# Patient Record
Sex: Female | Born: 1950 | Race: White | Hispanic: No | Marital: Married | Smoking: Never smoker
Health system: Southern US, Community
[De-identification: ages and names within clinical notes are randomized; demographics above are authoritative.]

## PROBLEM LIST (undated history)

## (undated) DIAGNOSIS — M1990 Unspecified osteoarthritis, unspecified site: Secondary | ICD-10-CM

## (undated) DIAGNOSIS — Z973 Presence of spectacles and contact lenses: Secondary | ICD-10-CM

## (undated) DIAGNOSIS — E785 Hyperlipidemia, unspecified: Secondary | ICD-10-CM

## (undated) DIAGNOSIS — K219 Gastro-esophageal reflux disease without esophagitis: Secondary | ICD-10-CM

## (undated) HISTORY — PX: APPENDECTOMY: SHX54

## (undated) HISTORY — PX: COLONOSCOPY: SHX174

## (undated) HISTORY — PX: TONSILLECTOMY: SUR1361

---

## 1996-08-23 HISTORY — PX: CARPAL TUNNEL RELEASE: SHX101

## 2005-11-17 ENCOUNTER — Other Ambulatory Visit: Admission: RE | Admit: 2005-11-17 | Discharge: 2005-11-17 | Payer: Self-pay | Admitting: Family Medicine

## 2005-11-29 ENCOUNTER — Encounter: Admission: RE | Admit: 2005-11-29 | Discharge: 2005-11-29 | Payer: Self-pay | Admitting: Family Medicine

## 2007-02-22 ENCOUNTER — Other Ambulatory Visit: Admission: RE | Admit: 2007-02-22 | Discharge: 2007-02-22 | Payer: Self-pay | Admitting: Family Medicine

## 2007-03-14 ENCOUNTER — Encounter: Admission: RE | Admit: 2007-03-14 | Discharge: 2007-03-14 | Payer: Self-pay | Admitting: Family Medicine

## 2008-03-08 ENCOUNTER — Other Ambulatory Visit: Admission: RE | Admit: 2008-03-08 | Discharge: 2008-03-08 | Payer: Self-pay | Admitting: Family Medicine

## 2008-03-15 ENCOUNTER — Encounter: Admission: RE | Admit: 2008-03-15 | Discharge: 2008-03-15 | Payer: Self-pay | Admitting: Family Medicine

## 2009-03-11 ENCOUNTER — Other Ambulatory Visit: Admission: RE | Admit: 2009-03-11 | Discharge: 2009-03-11 | Payer: Self-pay | Admitting: Family Medicine

## 2010-03-18 ENCOUNTER — Other Ambulatory Visit: Admission: RE | Admit: 2010-03-18 | Discharge: 2010-03-18 | Payer: Self-pay | Admitting: Family Medicine

## 2010-03-20 ENCOUNTER — Encounter: Admission: RE | Admit: 2010-03-20 | Discharge: 2010-03-20 | Payer: Self-pay | Admitting: Family Medicine

## 2010-08-23 HISTORY — PX: EYE SURGERY: SHX253

## 2010-11-05 ENCOUNTER — Other Ambulatory Visit: Payer: Self-pay | Admitting: Family Medicine

## 2010-11-05 DIAGNOSIS — R609 Edema, unspecified: Secondary | ICD-10-CM

## 2010-11-05 DIAGNOSIS — R52 Pain, unspecified: Secondary | ICD-10-CM

## 2010-11-06 ENCOUNTER — Ambulatory Visit
Admission: RE | Admit: 2010-11-06 | Discharge: 2010-11-06 | Disposition: A | Discharge status: Home / Self Care | Payer: BC Managed Care – HMO | Source: Ambulatory Visit | Attending: Family Medicine | Admitting: Family Medicine

## 2010-11-06 DIAGNOSIS — R609 Edema, unspecified: Secondary | ICD-10-CM

## 2010-11-06 DIAGNOSIS — R52 Pain, unspecified: Secondary | ICD-10-CM

## 2010-12-09 ENCOUNTER — Ambulatory Visit (HOSPITAL_BASED_OUTPATIENT_CLINIC_OR_DEPARTMENT_OTHER)
Admission: RE | Admit: 2010-12-09 | Discharge: 2010-12-09 | Disposition: A | Discharge status: Home / Self Care | Payer: BC Managed Care – PPO | Source: Ambulatory Visit | Attending: Specialist | Admitting: Specialist

## 2010-12-09 DIAGNOSIS — H269 Unspecified cataract: Secondary | ICD-10-CM | POA: Insufficient documentation

## 2010-12-23 ENCOUNTER — Ambulatory Visit (HOSPITAL_BASED_OUTPATIENT_CLINIC_OR_DEPARTMENT_OTHER)
Admission: RE | Admit: 2010-12-23 | Discharge: 2010-12-23 | Disposition: A | Discharge status: Home / Self Care | Payer: BC Managed Care – PPO | Source: Ambulatory Visit | Attending: Specialist | Admitting: Specialist

## 2010-12-23 DIAGNOSIS — E7800 Pure hypercholesterolemia, unspecified: Secondary | ICD-10-CM | POA: Insufficient documentation

## 2010-12-23 DIAGNOSIS — H269 Unspecified cataract: Secondary | ICD-10-CM | POA: Insufficient documentation

## 2010-12-23 DIAGNOSIS — Z79899 Other long term (current) drug therapy: Secondary | ICD-10-CM | POA: Insufficient documentation

## 2011-03-19 NOTE — Op Note (Signed)
  [name], Isabel Nash             ACCOUNT NO.:  [id number]  MEDICAL RECORD NO.:  [id number]          PATIENT TYPE:  LOCATION:                                 FACILITY:  PHYSICIAN:  Chucky May, M.D.  DATE OF BIRTH:  [birth year]  DATE OF PROCEDURE: DATE OF DISCHARGE:                              OPERATIVE REPORT   PREOPERATIVE DIAGNOSIS:  Cataract, left eye.  POSTOPERATIVE DIAGNOSIS:  Cataract, left eye.  OPERATION PERFORMED:  Cataract extraction with intraocular lens implant, left eye.  INDICATIONS FOR SURGERY:  The patient is a 60 year old female with progressive decrease in visual acuity bothered by glare when driving especially at night.  On examination, she was found to have a posterior subcapsular cataract consistent with her complaints.  SURGEON:  Chucky May, MD  ANESTHESIA:  MAC.  DESCRIPTION OF PROCEDURE:  The patient was brought to the main operating room and placed in the supine position.  Anesthesia was obtained by means of topical 4% lidocaine drops of tetracaine.  She was then prepped and draped in the usual manner.  A lid speculum was inserted and the cornea was irrigated with 4% lidocaine drops.  The cornea was then entered temporally with a keratome with an additional port inferiorly. Viscoelastic was instilled into the anterior chamber and anterior capsulorrhexis was performed without difficulty.  The nucleus was mobilized by hydrodissection followed by phacoemulsification of the nucleus and removal of residual cortical material by irrigation and aspiration.  The posterior capsule was then polished.  Provisc was instilled into the capsular bag and a posterior chamber lens implant model MN60MA, power 4.0 diopters, was instilled into the capsular bag without difficulty.  Viscoelastic was removed by irrigation and aspiration and replaced with balanced salt solution.  The wounds were hydrated with balanced salt solution.  A single 10-0 nylon suture  was placed across the wound temporally.  The eye was then checked for fluid leaks and none were noted.  The eye was dressed with topical Vigamox, Pred Forte and a Fox shield, and the patient was taken to the recovery room in excellent condition where she received written and verbal instructions for her postoperative care and was scheduled for followup in 24 hours.          ______________________________ Chucky May, M.D.     DJD/MEDQ  D:  12/09/2010  T:  12/10/2010  Job:  [id number]  Electronically Signed by Nelson Chimes M.D. on 03/19/2011 09:25:18 AM

## 2011-03-19 NOTE — Op Note (Signed)
  Isabel Nash, Isabel Nash             ACCOUNT NO.:  [id number]  MEDICAL RECORD NO.:  [id number]          PATIENT TYPE:  LOCATION:                                 FACILITY:  PHYSICIAN:  Chucky May, M.D.  DATE OF BIRTH:  [birth year]  DATE OF PROCEDURE: DATE OF DISCHARGE:                              OPERATIVE REPORT   SURGEON:  Chucky May, M.D.  PREOPERATIVE DIAGNOSIS:  Cataract, right eye.  POSTOPERATIVE DIAGNOSIS:  Cataract, right eye.  OPERATION PERFORMED:  Cataract extraction with intraocular lens implant, right eye.  ANESTHESIA:  MAC.  The outpatient setting is the appropriate setting for this procedure.  INDICATIONS FOR SURGERY:  The patient is a 60 year old female with painless progressive decrease in vision so that she has difficulty seeing for activities of daily living.  Specifically she has difficulty with driving and reading.  PROCEDURE IN DETAIL:  The patient was brought to the main operating room and placed in the supine position.  Anesthesia was obtained by means of 4% lidocaine drops with tetracaine.  She was then prepped and draped in the usual manner.  The cornea was entered temporally with a 2.4 mm keratome with an additional port of 1 mm diameter superiorly. Viscoelastic was instilled into the anterior chamber and an anterior capsulorrhexis was performed without difficulty.  The nucleus was mobilized by hydrodissection followed by phacoemulsification.  When the lens had been phacoemulsified, residual cortical material was removed by irrigation and aspiration.  The posterior capsule was polished and a posterior chamber lens implant was placed in the bag without difficulty. The viscoelastic was removed by irrigation and aspiration and replaced with balanced salt solution.  The wound was then hydrated with balanced salt solution and checked for fluid leaks and none were noted.  The eye was dressed with topical Vigamox and Pred Forte as well as a Fox  shield and the patient was taken to the recovery room in excellent condition where she received written and verbal instructions for her postoperative care and was scheduled for followup in 24 hours.          ______________________________ Chucky May, M.D.     DJD/MEDQ  D:  12/24/2010  T:  12/24/2010  Job:  [id number]  Electronically Signed by Nelson Chimes M.D. on 03/19/2011 09:25:25 AM

## 2011-04-07 ENCOUNTER — Other Ambulatory Visit (HOSPITAL_COMMUNITY)
Admission: RE | Admit: 2011-04-07 | Discharge: 2011-04-07 | Disposition: A | Discharge status: Home / Self Care | Payer: BC Managed Care – PPO | Source: Ambulatory Visit | Attending: Family Medicine | Admitting: Family Medicine

## 2011-04-07 ENCOUNTER — Other Ambulatory Visit: Payer: Self-pay | Admitting: Family Medicine

## 2011-04-07 DIAGNOSIS — Z124 Encounter for screening for malignant neoplasm of cervix: Secondary | ICD-10-CM | POA: Insufficient documentation

## 2011-06-07 ENCOUNTER — Other Ambulatory Visit: Payer: Self-pay | Admitting: Family Medicine

## 2011-06-07 DIAGNOSIS — Z1231 Encounter for screening mammogram for malignant neoplasm of breast: Secondary | ICD-10-CM

## 2011-06-15 ENCOUNTER — Ambulatory Visit
Admission: RE | Admit: 2011-06-15 | Discharge: 2011-06-15 | Disposition: A | Discharge status: Home / Self Care | Payer: BC Managed Care – HMO | Source: Ambulatory Visit | Attending: Family Medicine | Admitting: Family Medicine

## 2011-06-15 DIAGNOSIS — Z1231 Encounter for screening mammogram for malignant neoplasm of breast: Secondary | ICD-10-CM

## 2012-09-15 ENCOUNTER — Other Ambulatory Visit: Payer: Self-pay | Admitting: Family Medicine

## 2012-09-15 DIAGNOSIS — Z1231 Encounter for screening mammogram for malignant neoplasm of breast: Secondary | ICD-10-CM

## 2012-09-19 ENCOUNTER — Ambulatory Visit
Admission: RE | Admit: 2012-09-19 | Discharge: 2012-09-19 | Disposition: A | Discharge status: Home / Self Care | Payer: BC Managed Care – PPO | Source: Ambulatory Visit | Attending: Family Medicine | Admitting: Family Medicine

## 2012-09-19 DIAGNOSIS — Z1231 Encounter for screening mammogram for malignant neoplasm of breast: Secondary | ICD-10-CM

## 2013-06-07 ENCOUNTER — Other Ambulatory Visit: Payer: Self-pay | Admitting: Family Medicine

## 2013-06-07 ENCOUNTER — Ambulatory Visit
Admission: RE | Admit: 2013-06-07 | Discharge: 2013-06-07 | Disposition: A | Discharge status: Home / Self Care | Payer: BC Managed Care – PPO | Source: Ambulatory Visit | Attending: Family Medicine | Admitting: Family Medicine

## 2013-06-07 DIAGNOSIS — Z23 Encounter for immunization: Secondary | ICD-10-CM

## 2013-08-08 ENCOUNTER — Other Ambulatory Visit: Payer: Self-pay | Admitting: Family Medicine

## 2013-08-08 DIAGNOSIS — Z780 Asymptomatic menopausal state: Secondary | ICD-10-CM

## 2013-08-09 ENCOUNTER — Other Ambulatory Visit: Payer: Self-pay | Admitting: Family Medicine

## 2013-08-09 DIAGNOSIS — Z1231 Encounter for screening mammogram for malignant neoplasm of breast: Secondary | ICD-10-CM

## 2013-09-24 ENCOUNTER — Ambulatory Visit
Admission: RE | Admit: 2013-09-24 | Discharge: 2013-09-24 | Disposition: A | Discharge status: Home / Self Care | Payer: Self-pay | Source: Ambulatory Visit | Attending: Family Medicine | Admitting: Family Medicine

## 2013-09-24 DIAGNOSIS — Z780 Asymptomatic menopausal state: Secondary | ICD-10-CM

## 2013-09-24 DIAGNOSIS — Z1231 Encounter for screening mammogram for malignant neoplasm of breast: Secondary | ICD-10-CM

## 2013-09-26 ENCOUNTER — Other Ambulatory Visit: Payer: Self-pay | Admitting: Family Medicine

## 2013-09-26 DIAGNOSIS — R928 Other abnormal and inconclusive findings on diagnostic imaging of breast: Secondary | ICD-10-CM

## 2013-10-05 ENCOUNTER — Ambulatory Visit
Admission: RE | Admit: 2013-10-05 | Discharge: 2013-10-05 | Disposition: A | Discharge status: Home / Self Care | Payer: BC Managed Care – PPO | Source: Ambulatory Visit | Attending: Family Medicine | Admitting: Family Medicine

## 2013-10-05 ENCOUNTER — Ambulatory Visit
Admission: RE | Admit: 2013-10-05 | Discharge: 2013-10-05 | Disposition: A | Discharge status: Home / Self Care | Payer: Self-pay | Source: Ambulatory Visit | Attending: Family Medicine | Admitting: Family Medicine

## 2013-10-05 DIAGNOSIS — R928 Other abnormal and inconclusive findings on diagnostic imaging of breast: Secondary | ICD-10-CM

## 2013-11-08 DIAGNOSIS — M21619 Bunion of unspecified foot: Secondary | ICD-10-CM | POA: Diagnosis not present

## 2013-11-08 DIAGNOSIS — M2020 Hallux rigidus, unspecified foot: Secondary | ICD-10-CM | POA: Diagnosis not present

## 2014-01-10 DIAGNOSIS — H31099 Other chorioretinal scars, unspecified eye: Secondary | ICD-10-CM | POA: Diagnosis not present

## 2014-01-10 DIAGNOSIS — H26499 Other secondary cataract, unspecified eye: Secondary | ICD-10-CM | POA: Diagnosis not present

## 2014-01-22 DIAGNOSIS — N649 Disorder of breast, unspecified: Secondary | ICD-10-CM | POA: Diagnosis not present

## 2014-04-08 DIAGNOSIS — R6889 Other general symptoms and signs: Secondary | ICD-10-CM | POA: Diagnosis not present

## 2014-04-08 DIAGNOSIS — J029 Acute pharyngitis, unspecified: Secondary | ICD-10-CM | POA: Diagnosis not present

## 2014-04-19 ENCOUNTER — Ambulatory Visit (INDEPENDENT_AMBULATORY_CARE_PROVIDER_SITE_OTHER): Payer: Self-pay

## 2014-04-19 ENCOUNTER — Ambulatory Visit (INDEPENDENT_AMBULATORY_CARE_PROVIDER_SITE_OTHER): Payer: Medicare Other | Admitting: Neurology

## 2014-04-19 DIAGNOSIS — G5602 Carpal tunnel syndrome, left upper limb: Secondary | ICD-10-CM

## 2014-04-19 DIAGNOSIS — G5600 Carpal tunnel syndrome, unspecified upper limb: Secondary | ICD-10-CM | POA: Diagnosis not present

## 2014-04-19 DIAGNOSIS — Z0289 Encounter for other administrative examinations: Secondary | ICD-10-CM

## 2014-04-19 NOTE — Progress Notes (Signed)
  [facility]    Provider:  Dr Jaynee Eagles Referring Provider: Jonathon Bellows, MD Primary Care Physician:  Jonathon Bellows, MD  CC:  Numbness left hand  HPI:  Isabel Nash is a 63 y.o. female here as a referral from Dr. Justin Mend for evaluation of left Carpal Tunnel Syndrome. Patient has had symptoms for years. Has had right Carpal Tunnel Release. More recently noticed that her left hand is getting numb especially when she rides her bike. Digits 1-4 involved and she gets shooting pains into her wrists. Sometimes she wakes with hand numbness. Denies weakness. Denies neck pain or radicular symptoms. Focused exam demonstrates intact strength including intrinsic hand muscles.   Summary:   Nerve Conductions were performed on the bilateral upper extremities.  Evaluation of the left median motor nerve showed prolonged distal onset latency (4.9 ms, N<4.).   The left Median 2nd Digit sensory nerve showed prolonged distal peak latency (4.39ms, N<3.9) The right Median motor and sensory conductions were within normal limits. Bilateral Ulnar sensory and motor conduction were within normal limits.  F Wave studies indicate that the right and left median F waves were within normal limits.  EMG needle study of the right Deltoid, Triceps, Pronator Teres,  First Dorsal Interosseus and Opponens Pollicis muscles were normal.   Conclusion:  There is electrophysiologic evidence for moderately-severe left Carpal Tunnel Syndrome. No indication of peripheral polyneuropathy.   Sarina Ill, MD  [facility]  [address] [place]  [place], [place] [address]  Phone [phone] Fax [phone]

## 2014-05-20 DIAGNOSIS — Z23 Encounter for immunization: Secondary | ICD-10-CM | POA: Diagnosis not present

## 2014-07-03 DIAGNOSIS — E780 Pure hypercholesterolemia: Secondary | ICD-10-CM | POA: Diagnosis not present

## 2014-07-03 DIAGNOSIS — G5602 Carpal tunnel syndrome, left upper limb: Secondary | ICD-10-CM | POA: Diagnosis not present

## 2014-07-04 ENCOUNTER — Other Ambulatory Visit: Payer: Self-pay | Admitting: Orthopedic Surgery

## 2014-07-08 DIAGNOSIS — L720 Epidermal cyst: Secondary | ICD-10-CM | POA: Diagnosis not present

## 2014-07-08 DIAGNOSIS — L821 Other seborrheic keratosis: Secondary | ICD-10-CM | POA: Diagnosis not present

## 2014-07-08 DIAGNOSIS — Z85828 Personal history of other malignant neoplasm of skin: Secondary | ICD-10-CM | POA: Diagnosis not present

## 2014-07-08 DIAGNOSIS — Z808 Family history of malignant neoplasm of other organs or systems: Secondary | ICD-10-CM | POA: Diagnosis not present

## 2014-07-08 DIAGNOSIS — D229 Melanocytic nevi, unspecified: Secondary | ICD-10-CM | POA: Diagnosis not present

## 2014-07-29 ENCOUNTER — Encounter (HOSPITAL_BASED_OUTPATIENT_CLINIC_OR_DEPARTMENT_OTHER): Payer: Self-pay | Admitting: *Deleted

## 2014-07-29 NOTE — Progress Notes (Signed)
No labs needed

## 2014-08-01 ENCOUNTER — Encounter (HOSPITAL_BASED_OUTPATIENT_CLINIC_OR_DEPARTMENT_OTHER): Payer: Self-pay | Admitting: Certified Registered"

## 2014-08-01 ENCOUNTER — Ambulatory Visit (HOSPITAL_BASED_OUTPATIENT_CLINIC_OR_DEPARTMENT_OTHER): Payer: Medicare Other | Admitting: Certified Registered"

## 2014-08-01 ENCOUNTER — Ambulatory Visit (HOSPITAL_BASED_OUTPATIENT_CLINIC_OR_DEPARTMENT_OTHER)
Admission: RE | Admit: 2014-08-01 | Discharge: 2014-08-01 | Disposition: A | Discharge status: Home / Self Care | Payer: Medicare Other | Source: Ambulatory Visit | Attending: Orthopedic Surgery | Admitting: Orthopedic Surgery

## 2014-08-01 ENCOUNTER — Encounter (HOSPITAL_BASED_OUTPATIENT_CLINIC_OR_DEPARTMENT_OTHER)
Admission: RE | Disposition: A | Discharge status: Home / Self Care | Payer: Self-pay | Source: Ambulatory Visit | Attending: Orthopedic Surgery

## 2014-08-01 DIAGNOSIS — K219 Gastro-esophageal reflux disease without esophagitis: Secondary | ICD-10-CM | POA: Diagnosis not present

## 2014-08-01 DIAGNOSIS — G5602 Carpal tunnel syndrome, left upper limb: Secondary | ICD-10-CM | POA: Insufficient documentation

## 2014-08-01 DIAGNOSIS — M1990 Unspecified osteoarthritis, unspecified site: Secondary | ICD-10-CM | POA: Insufficient documentation

## 2014-08-01 DIAGNOSIS — E785 Hyperlipidemia, unspecified: Secondary | ICD-10-CM | POA: Insufficient documentation

## 2014-08-01 HISTORY — PX: CARPAL TUNNEL RELEASE: SHX101

## 2014-08-01 HISTORY — DX: Presence of spectacles and contact lenses: Z97.3

## 2014-08-01 HISTORY — DX: Hyperlipidemia, unspecified: E78.5

## 2014-08-01 HISTORY — DX: Unspecified osteoarthritis, unspecified site: M19.90

## 2014-08-01 HISTORY — DX: Gastro-esophageal reflux disease without esophagitis: K21.9

## 2014-08-01 LAB — POCT HEMOGLOBIN-HEMACUE: HEMOGLOBIN: 16 g/dL — AB (ref 12.0–15.0)

## 2014-08-01 SURGERY — CARPAL TUNNEL RELEASE
Anesthesia: Monitor Anesthesia Care | Site: Wrist | Laterality: Left

## 2014-08-01 MED ORDER — CEFAZOLIN SODIUM-DEXTROSE 2-3 GM-% IV SOLR: 2.0000 g | INTRAVENOUS | Status: DC

## 2014-08-01 MED ORDER — MIDAZOLAM HCL 5 MG/5ML IJ SOLN
INTRAMUSCULAR | Status: DC | PRN
  Administered 2014-08-01: 1 mg via INTRAVENOUS

## 2014-08-01 MED ORDER — HYDROCODONE-ACETAMINOPHEN 5-325 MG PO TABS: ORAL_TABLET | ORAL | Status: AC

## 2014-08-01 MED ORDER — VANCOMYCIN HCL 1000 MG IV SOLR
1000.0000 mg | INTRAVENOUS | Status: DC | PRN
  Administered 2014-08-01: 1000 mg via INTRAVENOUS

## 2014-08-01 MED ORDER — FENTANYL CITRATE 0.05 MG/ML IJ SOLN: 50.0000 ug | INTRAMUSCULAR | Status: DC | PRN

## 2014-08-01 MED ORDER — VANCOMYCIN HCL IN DEXTROSE 1-5 GM/200ML-% IV SOLN
INTRAVENOUS | Status: AC
  Filled 2014-08-01: qty 200

## 2014-08-01 MED ORDER — ONDANSETRON HCL 4 MG/2ML IJ SOLN
INTRAMUSCULAR | Status: DC | PRN
  Administered 2014-08-01: 4 mg via INTRAVENOUS

## 2014-08-01 MED ORDER — LACTATED RINGERS IV SOLN
INTRAVENOUS | Status: DC
  Administered 2014-08-01: 08:00:00 via INTRAVENOUS

## 2014-08-01 MED ORDER — FENTANYL CITRATE 0.05 MG/ML IJ SOLN: 25.0000 ug | INTRAMUSCULAR | Status: DC | PRN

## 2014-08-01 MED ORDER — FENTANYL CITRATE 0.05 MG/ML IJ SOLN
INTRAMUSCULAR | Status: AC
  Filled 2014-08-01: qty 4

## 2014-08-01 MED ORDER — MIDAZOLAM HCL 2 MG/2ML IJ SOLN: 1.0000 mg | INTRAMUSCULAR | Status: DC | PRN

## 2014-08-01 MED ORDER — PROPOFOL 10 MG/ML IV BOLUS
INTRAVENOUS | Status: DC | PRN
  Administered 2014-08-01: 10 mg via INTRAVENOUS
  Administered 2014-08-01: 20 mg via INTRAVENOUS

## 2014-08-01 MED ORDER — LIDOCAINE HCL (PF) 0.5 % IJ SOLN
INTRAMUSCULAR | Status: DC | PRN
  Administered 2014-08-01: 25 mL via INTRAVENOUS

## 2014-08-01 MED ORDER — BUPIVACAINE HCL (PF) 0.25 % IJ SOLN
INTRAMUSCULAR | Status: DC | PRN
  Administered 2014-08-01: 10 mL

## 2014-08-01 MED ORDER — CEFAZOLIN SODIUM-DEXTROSE 2-3 GM-% IV SOLR
INTRAVENOUS | Status: AC
  Filled 2014-08-01: qty 50

## 2014-08-01 MED ORDER — FENTANYL CITRATE 0.05 MG/ML IJ SOLN
INTRAMUSCULAR | Status: DC | PRN
  Administered 2014-08-01: 50 ug via INTRAVENOUS

## 2014-08-01 MED ORDER — MIDAZOLAM HCL 2 MG/2ML IJ SOLN
INTRAMUSCULAR | Status: AC
  Filled 2014-08-01: qty 2

## 2014-08-01 MED ORDER — BUPIVACAINE HCL (PF) 0.25 % IJ SOLN
INTRAMUSCULAR | Status: AC
  Filled 2014-08-01: qty 30

## 2014-08-01 MED ORDER — CHLORHEXIDINE GLUCONATE 4 % EX LIQD: 60.0000 mL | Freq: Once | CUTANEOUS | Status: DC

## 2014-08-01 MED ORDER — PROPOFOL 10 MG/ML IV BOLUS
INTRAVENOUS | Status: AC
  Filled 2014-08-01: qty 20

## 2014-08-01 MED ORDER — ONDANSETRON HCL 4 MG/2ML IJ SOLN: 4.0000 mg | Freq: Once | INTRAMUSCULAR | Status: DC | PRN

## 2014-08-01 SURGICAL SUPPLY — 35 items
BANDAGE ELASTIC 3 VELCRO ST LF (GAUZE/BANDAGES/DRESSINGS) ×2 IMPLANT
BLADE MINI RND TIP GREEN BEAV (BLADE) IMPLANT
BLADE SURG 15 STRL LF DISP TIS (BLADE) ×2 IMPLANT
BLADE SURG 15 STRL SS (BLADE) ×4
BNDG CMPR 9X4 STRL LF SNTH (GAUZE/BANDAGES/DRESSINGS)
BNDG ESMARK 4X9 LF (GAUZE/BANDAGES/DRESSINGS) IMPLANT
BNDG GAUZE ELAST 4 BULKY (GAUZE/BANDAGES/DRESSINGS) ×2 IMPLANT
CHLORAPREP W/TINT 26ML (MISCELLANEOUS) ×2 IMPLANT
CORDS BIPOLAR (ELECTRODE) ×2 IMPLANT
COVER BACK TABLE 60X90IN (DRAPES) ×2 IMPLANT
COVER MAYO STAND STRL (DRAPES) ×2 IMPLANT
CUFF TOURNIQUET SINGLE 18IN (TOURNIQUET CUFF) ×2 IMPLANT
DRAPE EXTREMITY T 121X128X90 (DRAPE) ×2 IMPLANT
DRAPE SURG 17X23 STRL (DRAPES) ×2 IMPLANT
DRSG PAD ABDOMINAL 8X10 ST (GAUZE/BANDAGES/DRESSINGS) ×2 IMPLANT
GAUZE SPONGE 4X4 12PLY STRL (GAUZE/BANDAGES/DRESSINGS) ×2 IMPLANT
GAUZE XEROFORM 1X8 LF (GAUZE/BANDAGES/DRESSINGS) ×2 IMPLANT
GLOVE BIO SURGEON STRL SZ7.5 (GLOVE) ×2 IMPLANT
GLOVE BIOGEL PI IND STRL 8 (GLOVE) ×1 IMPLANT
GLOVE BIOGEL PI INDICATOR 8 (GLOVE) ×1
GOWN STRL REUS W/ TWL LRG LVL3 (GOWN DISPOSABLE) ×1 IMPLANT
GOWN STRL REUS W/TWL LRG LVL3 (GOWN DISPOSABLE) ×2
GOWN STRL REUS W/TWL XL LVL3 (GOWN DISPOSABLE) ×2 IMPLANT
NDL HYPO 25X1 1.5 SAFETY (NEEDLE) IMPLANT
NEEDLE HYPO 25X1 1.5 SAFETY (NEEDLE) IMPLANT
NS IRRIG 1000ML POUR BTL (IV SOLUTION) ×2 IMPLANT
PACK BASIN DAY SURGERY FS (CUSTOM PROCEDURE TRAY) ×2 IMPLANT
PADDING CAST ABS 4INX4YD NS (CAST SUPPLIES) ×1
PADDING CAST ABS COTTON 4X4 ST (CAST SUPPLIES) ×1 IMPLANT
STOCKINETTE 4X48 STRL (DRAPES) ×2 IMPLANT
SUT ETHILON 4 0 PS 2 18 (SUTURE) ×2 IMPLANT
SYR BULB 3OZ (MISCELLANEOUS) ×2 IMPLANT
SYR CONTROL 10ML LL (SYRINGE) IMPLANT
TOWEL OR 17X24 6PK STRL BLUE (TOWEL DISPOSABLE) ×4 IMPLANT
UNDERPAD 30X30 INCONTINENT (UNDERPADS AND DIAPERS) ×2 IMPLANT

## 2014-08-01 NOTE — Op Note (Signed)
[name], [name]:  [id number]  MEDICAL RECORD NO.:  [id number]  LOCATION:                                 FACILITY:  PHYSICIAN:  Leanora Cover, MD             DATE OF BIRTH:  DATE OF PROCEDURE:  08/01/2014 DATE OF DISCHARGE:                              OPERATIVE REPORT   PREOPERATIVE DIAGNOSIS:  Left carpal tunnel syndrome.  POSTOPERATIVE DIAGNOSIS:  Left carpal tunnel syndrome.  PROCEDURE:  Left carpal tunnel release.  SURGEON:  Leanora Cover, MD  ASSISTANT:  None.  ANESTHESIA:  Bier block.  IV FLUIDS:  Per anesthesia flow sheet.  ESTIMATED BLOOD LOSS:  Minimal.  COMPLICATIONS:  None.  SPECIMENS:  None.  TOURNIQUET TIME:  22 minutes.  DISPOSITION:  Stable to PACU.  INDICATIONS:  [name] is a 63 year old female, who has had pins and needle sensation in the left hand for approximately 20 years.  She has had a right carpal tunnel release in the past with good results.  She does have a left carpal release.  Risks, benefits, and alternatives of surgery were discussed including the risk of blood loss, infection, damage to nerves, vessels, tendons, ligaments, bone; failure of surgery; need for additional surgery, complications with wound healing, continued pain, recurrence of carpal tunnel syndrome and damage to motor branch. She voiced understanding of these risks, and elected to proceed..  OPERATIVE COURSE:  After being identified preoperatively by myself, the patient and I agreed upon the procedure and site of procedure.  Surgical site was marked.  The risks, benefits, and alternatives of surgery were reviewed and she wished to proceed.  Surgical consent had been signed. She was given IV vancomycin as preoperative antibiotic prophylaxis for Ceclor allergy.  She was transported to the operating room and placed on the operating room table in supine position with left upper extremity on armboard.  Bier block anesthesia was induced by  Anesthesiologist.  Left upper extremity was prepped and draped in normal sterile orthopedic fashion.  A surgical pause was performed between surgeons, anesthesia and operating room staff, and all were in agreement as to the patient, procedure, and site of procedure.  Tourniquet at the proximal aspect of the forearm had been inflated for the Bier block.  Incision was made over the transverse carpal ligament.  This was carried into subcutaneous tissues by spreading technique.  Bipolar electrocautery was used to obtain hemostasis.  The palmar fascia was sharply incised.  The ligament was identified and incised sharply.  It was incised distally first. Care was taken to ensure complete decompression distally and proximally. Scissors were used to split the distal aspect of the volar antebrachial fascia.  Finger was placed into the wound to ensure complete decompression which was the case.  The nerve was inspected.  It was adherent to the radial leaflet.  The motor branch was identified and was intact.  The wound was copiously irrigated with sterile saline.  It was closed with 4-0 nylon in a horizontal mattress fashion.  I injected with 10 mL of 0.25% plain Marcaine to aid in postoperative analgesia.  It was then dressed with sterile Xeroform, 4x4s,  and ABD and wrapped with Kerlix and Ace bandage.  Tourniquet was deflated at 22 minutes.  The fingertips were pink with brisk capillary refill after deflation of the tourniquet.  Preoperative drapes were broken down.  The patient was awoken from anesthesia safely.  She was transferred back to stretcher and taken to PACU in stable condition.  I will see her back in the office in 1 week for postoperative followup.  I will give her Norco 5/325, 1-2 p.o. q.6 hours p.r.n. pain, dispensed #40.     Leanora Cover, MD     KK/MEDQ  D:  08/01/2014  T:  08/01/2014  Job:  [id number]

## 2014-08-01 NOTE — H&P (Signed)
  Isabel Nash is an 63 y.o. female.   Chief Complaint: carpal tunnel syndrome HPI: 63 yo rhd female with 20+ years pins and needles sensation in left hand.  It is bothersome to her.  She notes it mostly with sewing.  Has had a previous right carpal tunnel release.  Splinting and antiinflammatories without relief.  She wishes to have a left carpal tunnel release.  Past Medical History  Diagnosis Date  . GERD (gastroesophageal reflux disease)   . Arthritis   . Hyperlipemia   . Wears glasses     Past Surgical History  Procedure Laterality Date  . Tonsillectomy    . Appendectomy    . Carpal tunnel release  1998    right  . Eye surgery  2012    both cataracts  . Colonoscopy      History reviewed. No pertinent family history. Social History:  reports that she has never smoked. She does not have any smokeless tobacco history on file. She reports that she drinks alcohol. She reports that she does not use illicit drugs.  Allergies:  Allergies  Allergen Reactions  . Ceclor [Cefaclor] Hives    Face swells  . Keflex [Cephalexin] Hives    Medications Prior to Admission  Medication Sig Dispense Refill  . Calcium Carb-Cholecalciferol (CALCIUM 1000 + D) 1000-800 MG-UNIT TABS Take by mouth.    . naproxen sodium (ANAPROX) 220 MG tablet Take 220 mg by mouth 2 (two) times daily with a meal.    . ranitidine (ZANTAC) 150 MG tablet Take 150 mg by mouth 2 (two) times daily.    . simvastatin (ZOCOR) 20 MG tablet Take 20 mg by mouth daily at 6 PM.      Results for orders placed or performed during the hospital encounter of 08/01/14 (from the past 48 hour(s))  Hemoglobin-hemacue, POC     Status: Abnormal   Collection Time: 08/01/14  7:44 AM  Result Value Ref Range   Hemoglobin 16.0 (H) 12.0 - 15.0 g/dL    No results found.   A comprehensive review of systems was negative except for: Eyes: positive for cataracts and contacts/glasses Musculoskeletal: positive for arthralgias  Blood  pressure 104/72, pulse 91, temperature 98 F (36.7 C), temperature source Oral, resp. rate 20, height 5' (1.524 m), weight 56.246 kg (124 lb), SpO2 100 %.  General appearance: alert, cooperative and appears stated age Head: Normocephalic, without obvious abnormality, atraumatic Neck: supple, symmetrical, trachea midline Resp: clear to auscultation bilaterally Cardio: regular rate and rhythm GI: non tender Extremities: intact sensation and capillary refill all digits.  +epl/fpl/io.  no wounds. Pulses: 2+ and symmetric Skin: Skin color, texture, turgor normal. No rashes or lesions Neurologic: Grossly normal Incision/Wound: None  Assessment/Plan Left carpal tunnel syndrome.  Non operative and operative treatment options were discussed with the patient and patient wishes to proceed with operative treatment. Risks, benefits, and alternatives of surgery were discussed and the patient agrees with the plan of care.   Avraham Benish R 08/01/2014, 8:28 AM

## 2014-08-01 NOTE — Anesthesia Postprocedure Evaluation (Signed)
  Anesthesia Post-op Note  Patient: Isabel Nash  Procedure(s) Performed: Procedure(s): LEFT CARPAL TUNNEL RELEASE (Left)  Patient Location: PACU  Anesthesia Type: MAC, Bier Block   Level of Consciousness: awake, alert  and oriented  Airway and Oxygen Therapy: Patient Spontanous Breathing  Post-op Pain: none  Post-op Assessment: Post-op Vital signs reviewed  Post-op Vital Signs: Reviewed  Last Vitals:  Filed Vitals:   08/01/14 1016  BP: 101/61  Pulse: 74  Temp: 36.4 C  Resp: 18    Complications: No apparent anesthesia complications

## 2014-08-01 NOTE — Brief Op Note (Signed)
08/01/2014  9:09 AM  PATIENT:  Isabel Nash  63 y.o. female  PRE-OPERATIVE DIAGNOSIS:  LEFT CARPAL TUNNEL SYNDROME  POST-OPERATIVE DIAGNOSIS:  left carpal tunnel release  PROCEDURE:  Procedure(s): LEFT CARPAL TUNNEL RELEASE (Left)  SURGEON:  Surgeon(s) and Role:    * Leanora Cover, MD - Primary  PHYSICIAN ASSISTANT:   ASSISTANTS: none   ANESTHESIA:   Bier block  EBL:  Total I/O In: 800 [I.V.:800] Out: -   BLOOD ADMINISTERED:none  DRAINS: none   LOCAL MEDICATIONS USED:  MARCAINE     SPECIMEN:  No Specimen  DISPOSITION OF SPECIMEN:  N/A  COUNTS:  YES  TOURNIQUET:   Total Tourniquet Time Documented: Forearm (Left) - 23 minutes Total: Forearm (Left) - 23 minutes   DICTATION: .Other Dictation: Dictation Number ?  PLAN OF CARE: Discharge to home after PACU  PATIENT DISPOSITION:  PACU - hemodynamically stable.

## 2014-08-01 NOTE — Discharge Instructions (Addendum)

## 2014-08-01 NOTE — Transfer of Care (Signed)
Immediate Anesthesia Transfer of Care Note  Patient: Isabel Nash  Procedure(s) Performed: Procedure(s): LEFT CARPAL TUNNEL RELEASE (Left)  Patient Location: PACU  Anesthesia Type:MAC and Bier block  Level of Consciousness: awake, alert  and oriented  Airway & Oxygen Therapy: Patient Spontanous Breathing and Patient connected to face mask oxygen  Post-op Assessment: Report given to PACU RN, Post -op Vital signs reviewed and stable and Patient moving all extremities  Post vital signs: Reviewed and stable  Complications: No apparent anesthesia complications

## 2014-08-01 NOTE — Anesthesia Procedure Notes (Signed)
Procedure Name: MAC Date/Time: 08/01/2014 8:50 AM Performed by: Baxter Flattery Pre-anesthesia Checklist: Patient identified, Emergency Drugs available, Suction available and Patient being monitored Patient Re-evaluated:Patient Re-evaluated prior to inductionOxygen Delivery Method: Simple face mask Preoxygenation: Pre-oxygenation with 100% oxygen Dental Injury: Teeth and Oropharynx as per pre-operative assessment

## 2014-08-01 NOTE — Anesthesia Preprocedure Evaluation (Signed)
Anesthesia Evaluation  Patient identified by MRN, date of birth, ID band Patient awake    Reviewed: Allergy & Precautions, H&P , NPO status , Patient's Chart, lab work & pertinent test results  Airway Mallampati: II  TM Distance: >3 FB Neck ROM: Full    Dental  (+) Teeth Intact, Dental Advisory Given   Pulmonary  breath sounds clear to auscultation        Cardiovascular Rhythm:Regular Rate:Normal     Neuro/Psych    GI/Hepatic GERD-  Medicated and Controlled,  Endo/Other    Renal/GU      Musculoskeletal   Abdominal   Peds  Hematology   Anesthesia Other Findings   Reproductive/Obstetrics                             Anesthesia Physical Anesthesia Plan  ASA: II  Anesthesia Plan: MAC and Bier Block   Post-op Pain Management:    Induction: Intravenous  Airway Management Planned: Simple Face Mask  Additional Equipment:   Intra-op Plan:   Post-operative Plan:   Informed Consent: I have reviewed the patients History and Physical, chart, labs and discussed the procedure including the risks, benefits and alternatives for the proposed anesthesia with the patient or authorized representative who has indicated his/her understanding and acceptance.   Dental advisory given  Plan Discussed with: CRNA, Anesthesiologist and Surgeon  Anesthesia Plan Comments:         Anesthesia Quick Evaluation

## 2014-08-02 ENCOUNTER — Encounter (HOSPITAL_BASED_OUTPATIENT_CLINIC_OR_DEPARTMENT_OTHER): Payer: Self-pay | Admitting: Orthopedic Surgery

## 2014-10-02 DIAGNOSIS — M79604 Pain in right leg: Secondary | ICD-10-CM | POA: Diagnosis not present

## 2014-10-07 DIAGNOSIS — M25551 Pain in right hip: Secondary | ICD-10-CM | POA: Diagnosis not present

## 2014-10-07 DIAGNOSIS — M25561 Pain in right knee: Secondary | ICD-10-CM | POA: Diagnosis not present

## 2014-11-01 ENCOUNTER — Other Ambulatory Visit (HOSPITAL_COMMUNITY)
Admission: RE | Admit: 2014-11-01 | Discharge: 2014-11-01 | Disposition: A | Discharge status: Home / Self Care | Payer: Medicare Other | Source: Ambulatory Visit | Attending: Family Medicine | Admitting: Family Medicine

## 2014-11-01 ENCOUNTER — Other Ambulatory Visit: Payer: Self-pay | Admitting: Family Medicine

## 2014-11-01 DIAGNOSIS — Z124 Encounter for screening for malignant neoplasm of cervix: Secondary | ICD-10-CM | POA: Insufficient documentation

## 2014-11-01 DIAGNOSIS — E780 Pure hypercholesterolemia: Secondary | ICD-10-CM | POA: Diagnosis not present

## 2014-11-01 DIAGNOSIS — Z0000 Encounter for general adult medical examination without abnormal findings: Secondary | ICD-10-CM | POA: Diagnosis not present

## 2014-11-01 DIAGNOSIS — Z23 Encounter for immunization: Secondary | ICD-10-CM | POA: Diagnosis not present

## 2014-11-01 DIAGNOSIS — Z1151 Encounter for screening for human papillomavirus (HPV): Secondary | ICD-10-CM | POA: Diagnosis present

## 2014-11-06 LAB — CYTOLOGY - PAP

## 2014-12-06 ENCOUNTER — Other Ambulatory Visit: Payer: Self-pay

## 2014-12-06 DIAGNOSIS — Z1231 Encounter for screening mammogram for malignant neoplasm of breast: Secondary | ICD-10-CM

## 2014-12-23 ENCOUNTER — Ambulatory Visit: Payer: BC Managed Care – HMO

## 2014-12-31 ENCOUNTER — Ambulatory Visit
Admission: RE | Admit: 2014-12-31 | Discharge: 2014-12-31 | Disposition: A | Discharge status: Home / Self Care | Payer: Medicare Other | Source: Ambulatory Visit

## 2014-12-31 DIAGNOSIS — Z1231 Encounter for screening mammogram for malignant neoplasm of breast: Secondary | ICD-10-CM | POA: Diagnosis not present

## 2015-01-27 DIAGNOSIS — H26493 Other secondary cataract, bilateral: Secondary | ICD-10-CM | POA: Diagnosis not present

## 2015-01-27 DIAGNOSIS — H04123 Dry eye syndrome of bilateral lacrimal glands: Secondary | ICD-10-CM | POA: Diagnosis not present

## 2015-02-07 DIAGNOSIS — E780 Pure hypercholesterolemia: Secondary | ICD-10-CM | POA: Diagnosis not present

## 2015-03-12 DIAGNOSIS — M65312 Trigger thumb, left thumb: Secondary | ICD-10-CM | POA: Diagnosis not present

## 2015-05-09 DIAGNOSIS — M65312 Trigger thumb, left thumb: Secondary | ICD-10-CM | POA: Diagnosis not present

## 2015-06-12 DIAGNOSIS — M25561 Pain in right knee: Secondary | ICD-10-CM | POA: Diagnosis not present

## 2015-06-19 DIAGNOSIS — M25561 Pain in right knee: Secondary | ICD-10-CM | POA: Diagnosis not present

## 2015-07-24 DIAGNOSIS — L821 Other seborrheic keratosis: Secondary | ICD-10-CM | POA: Diagnosis not present

## 2015-07-24 DIAGNOSIS — Z85828 Personal history of other malignant neoplasm of skin: Secondary | ICD-10-CM | POA: Diagnosis not present

## 2015-07-24 DIAGNOSIS — D235 Other benign neoplasm of skin of trunk: Secondary | ICD-10-CM | POA: Diagnosis not present

## 2015-07-28 DIAGNOSIS — Z23 Encounter for immunization: Secondary | ICD-10-CM | POA: Diagnosis not present

## 2015-08-01 DIAGNOSIS — M65312 Trigger thumb, left thumb: Secondary | ICD-10-CM | POA: Diagnosis not present

## 2015-08-11 DIAGNOSIS — M65312 Trigger thumb, left thumb: Secondary | ICD-10-CM | POA: Diagnosis not present

## 2015-11-12 DIAGNOSIS — Z23 Encounter for immunization: Secondary | ICD-10-CM | POA: Diagnosis not present

## 2015-11-12 DIAGNOSIS — K219 Gastro-esophageal reflux disease without esophagitis: Secondary | ICD-10-CM | POA: Diagnosis not present

## 2015-11-12 DIAGNOSIS — Z0000 Encounter for general adult medical examination without abnormal findings: Secondary | ICD-10-CM | POA: Diagnosis not present

## 2015-11-12 DIAGNOSIS — E782 Mixed hyperlipidemia: Secondary | ICD-10-CM | POA: Diagnosis not present

## 2015-11-13 ENCOUNTER — Other Ambulatory Visit: Payer: Self-pay | Admitting: Family Medicine

## 2015-11-13 DIAGNOSIS — E2839 Other primary ovarian failure: Secondary | ICD-10-CM

## 2015-11-13 DIAGNOSIS — Z1231 Encounter for screening mammogram for malignant neoplasm of breast: Secondary | ICD-10-CM

## 2015-12-16 DIAGNOSIS — H903 Sensorineural hearing loss, bilateral: Secondary | ICD-10-CM | POA: Diagnosis not present

## 2016-01-08 ENCOUNTER — Ambulatory Visit
Admission: RE | Admit: 2016-01-08 | Discharge: 2016-01-08 | Disposition: A | Discharge status: Home / Self Care | Payer: Medicare Other | Source: Ambulatory Visit | Attending: Family Medicine | Admitting: Family Medicine

## 2016-01-08 DIAGNOSIS — M8589 Other specified disorders of bone density and structure, multiple sites: Secondary | ICD-10-CM | POA: Diagnosis not present

## 2016-01-08 DIAGNOSIS — Z780 Asymptomatic menopausal state: Secondary | ICD-10-CM | POA: Diagnosis not present

## 2016-01-08 DIAGNOSIS — E2839 Other primary ovarian failure: Secondary | ICD-10-CM

## 2016-01-08 DIAGNOSIS — Z1231 Encounter for screening mammogram for malignant neoplasm of breast: Secondary | ICD-10-CM

## 2016-01-12 ENCOUNTER — Other Ambulatory Visit: Payer: Self-pay | Admitting: Family Medicine

## 2016-01-12 DIAGNOSIS — R928 Other abnormal and inconclusive findings on diagnostic imaging of breast: Secondary | ICD-10-CM

## 2016-01-20 ENCOUNTER — Ambulatory Visit
Admission: RE | Admit: 2016-01-20 | Discharge: 2016-01-20 | Disposition: A | Discharge status: Home / Self Care | Payer: Medicare Other | Source: Ambulatory Visit | Attending: Family Medicine | Admitting: Family Medicine

## 2016-01-20 DIAGNOSIS — R928 Other abnormal and inconclusive findings on diagnostic imaging of breast: Secondary | ICD-10-CM

## 2016-02-18 DIAGNOSIS — H524 Presbyopia: Secondary | ICD-10-CM | POA: Diagnosis not present

## 2016-02-18 DIAGNOSIS — H26493 Other secondary cataract, bilateral: Secondary | ICD-10-CM | POA: Diagnosis not present

## 2016-02-18 DIAGNOSIS — H353122 Nonexudative age-related macular degeneration, left eye, intermediate dry stage: Secondary | ICD-10-CM | POA: Diagnosis not present

## 2016-03-18 ENCOUNTER — Ambulatory Visit: Payer: Medicare Other | Attending: Otolaryngology | Admitting: Audiology

## 2016-03-18 DIAGNOSIS — H903 Sensorineural hearing loss, bilateral: Secondary | ICD-10-CM | POA: Diagnosis not present

## 2016-03-18 DIAGNOSIS — H93293 Other abnormal auditory perceptions, bilateral: Secondary | ICD-10-CM | POA: Diagnosis not present

## 2016-03-18 DIAGNOSIS — Z0110 Encounter for examination of ears and hearing without abnormal findings: Secondary | ICD-10-CM | POA: Diagnosis not present

## 2016-03-18 NOTE — Patient Instructions (Signed)
  Auditory processing self-help computer programs are now available for IPAD and computer download.  Benefit has been shown with intensive use for 10-15 minutes,  4-5 days per week. Research is suggesting that using the programs for a short amount of time each day is better for the auditory processing development than completing the program in a short amount of time by doing it several hours per day.  [employer]  IPAD or PC download (Start with Phonological Awareness for decoding issues-which is the largest, most intensive program in this set.  Once Phonological Awareness is completed continue auditory processing work with the other [employer] programs: Auditory memory, Following Directions and Sequencing using the same 10-15 minutes, 4-5 days per week)                [employer]  (teenage to adult)     PC download or cd                                                    www.neurotone.com          Other self-help measures include: 1) have conversation face to face  2) minimize background noise when having a conversation- turn off the TV, move to a quiet area of the area 3) be aware that auditory processing problems become worse with fatigue and stress  4) Avoid having important conversation with [employer] back to the speaker.

## 2016-03-18 NOTE — Procedures (Signed)
[facility] [place], [place]  [address] [phone]  AUDIOLOGICAL AND AUDITORY PROCESSING EVALUATION  NAME: Isabel Nash  STATUS: Outpatient DOB:   [birth year]   DIAGNOSIS: Evaluate for Central auditory                                                                                    processing disorder  MRN: [id number]                                                                                      DATE: 03/18/2016   REFERENT:  Dr. Vicie Mutters, ENT   HISTORY: Isabel Nash, who recently retired as a Education officer, museum, was seen for a central auditory processing evaluation. She reports difficulty hearing in background noise at home and in social situations which is beginning to affect her communication confidence, creating some stress.  She also reports having difficulty understanding children and her husband at times.  Isabel Nash states that she has "low blood pressure" and feels unsteady in the morning, but that she does not have dizziness, vertigo, ear pressure or pain.  EVALUATION: Pure tone air conduction testing showed hearing thresholds of 10-20 dBHl from 250Hz  - 1500Hz ; 2000Hz  has 15 dBHL: on the left and 30 dBHL on the right ; 25-30 dBHL at 4000Hz ; 45 dBHL at 6000hz  and 55-65 dBHL at 8000Hz . The hearing loss is sensorineural bilaterally.   Speech reception thresholds are 10 dBHL on the left and 15 dBHL on the right using recorded spondee word lists. Word recognition was 96% at 55 dBHL on the right and 50 dBHL on the left using recorded NU-6 word lists, in quiet.  Additional testing not repeated since it was completed in April 2017.  A summary of Isabel Nash's central auditory processing evaluation is as follows: Uncomfortable Loudness Testing was performed using monitored live voice.  Isabel Nash reported that noise levels of 75 dBHL were "uncomfortably loud" in each ear.  Volume started to "hurt" at 100dBHL on the right side and  95 dBHL on the left side.     Speech-in-Noise testing was performed to determine speech discrimination in the presence of background noise.  Isabel Nash scored 68% in the right ear and 56 in the left ear, when noise was presented 5 dB below speech. Isabel Nash is expected to have significant difficulty hearing and understanding in minimal background noise.       Competing Sentences (CS) involved a different sentences being presented to each ear at different volumes. The instructions are to repeat the softer volume sentences. Posterior temporal issues will show poorer performance in the ear contralateral to the lobe involved.  Isabel Nash scored 100% in the right ear and 100% in the left ear.  The test results are within normal limits bilaterally.  Dichotic Digits (DD) presents different two digits to each  ear. All four digits are to be repeated. Poor performance suggests that cerebellar and/or brainstem may be involved. Isabel Nash scored 100% in the right ear and 100% in the left ear. The test results are within normal limits bilaterally.  Musiek's Frequency (Pitch) Pattern Test requires identification of high and low pitch tones presented each ear individually. Poor performance may occur with organization, learning issues or dyslexia.  Isabel Nash scored 100% on the left and 96% on the right which is within normal on this auditory processing test.   Summary of Isabel Nash's areas of difficulty: Poor Word Recognition in Minimal Background Noise is the inability to hear in the presence of competing noise. This problem may be easily mistaken for inattention.  Hearing may be excellent in a quiet room but become very poor when a fan, air conditioner or heater come on, paper is rattled or music is turned on. The background noise does not have to "sound loud" to a normal listener in order for it to be a problem for someone with an auditory processing disorder.     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   Word recognition is excellent in quiet but drops to poor in minimal background noise in each ear. Significant difficulty in most social situations is expected.  Previous audiograms were not available for comparison, but Isabel Nash plans to follow-up with Dr. Thornell Mule, ENT.  Although by history Isabel Nash is suspect of poor auditory processing, the test battery administered today is negative for  Central Auditory Processing Disorder. That said, since Isabel Nash is being adversely affected by her poor word recognition in background noise proactive measures were discussed with her that may be helpful both now and in the future (detailed under recommendations).  Please also be aware that low level amplification has been used in children with normal hearing thresholds to help with auditory processing so that this may be an option for Isabel Nash.  In addition, closely monitor hearing because the perception of poorer hearing sometimes occurs before additional hearing loss is evident on an audiogram.   RECOMMENDATIONS: 1)  Consider a hearing aid evaluation  2)  Consider proactive measures for optimum auditory processing. Auditory processing self-help computer programs are now available for IPAD and computer download.  Benefit has been shown with intensive use for 10-15 minutes,  4-5 days per week. Research is suggesting that using the programs for a short amount of time each day is better for the auditory processing development than completing the program in a short amount of time by doing it several hours per day.  [email or web address]  IPAD or PC download (Start with Phonological Awareness for decoding issues-which is the largest, most intensive program in this set.  Once Phonological Awareness is completed continue auditory processing work with the other Isabel Nash programs: Auditory memory, Following  Directions and Sequencing using the same 10-15 minutes, 4-5 days per week)                [employer]  (teenage to adult)     PC download or cd                                                    www.neurotone.com          3. Other  self-help measures include: 1) have conversation face to face  2) minimize background noise when having a conversation- turn off the TV, move to a quiet area of the area 3) be aware that auditory processing problems become worse with fatigue and stress  4) Avoid having important conversation with Lauryn's back to the speaker.   4.  Please be aware that current research strongly indicates that learning to play a musical instrument results in improved neurological function related to auditory processing that benefits decoding and hearing in background noise. Consider learning to  play a musical instrument for 1-2 years. Please be aware that being able to play the instrument well does not seem to matter, the benefit comes with the learning. Please refer to the following website for further info: www.brainvolts at [facility], Annia Friendly, PhD.   5.  Monitor hearing at home and with Dr. Mayer Masker office.  Deborah L. Heide Spark, Au.D., CCC-A Doctor of Audiology 03/18/2016

## 2016-04-08 DIAGNOSIS — M545 Low back pain: Secondary | ICD-10-CM | POA: Diagnosis not present

## 2016-04-12 DIAGNOSIS — M5136 Other intervertebral disc degeneration, lumbar region: Secondary | ICD-10-CM | POA: Diagnosis not present

## 2016-04-12 DIAGNOSIS — M545 Low back pain: Secondary | ICD-10-CM | POA: Diagnosis not present

## 2016-05-19 DIAGNOSIS — Z23 Encounter for immunization: Secondary | ICD-10-CM | POA: Diagnosis not present

## 2016-06-18 DIAGNOSIS — R05 Cough: Secondary | ICD-10-CM | POA: Diagnosis not present

## 2016-09-15 DIAGNOSIS — L821 Other seborrheic keratosis: Secondary | ICD-10-CM | POA: Diagnosis not present

## 2016-09-15 DIAGNOSIS — Z85828 Personal history of other malignant neoplasm of skin: Secondary | ICD-10-CM | POA: Diagnosis not present

## 2016-09-15 DIAGNOSIS — Z808 Family history of malignant neoplasm of other organs or systems: Secondary | ICD-10-CM | POA: Diagnosis not present

## 2016-09-15 DIAGNOSIS — Z23 Encounter for immunization: Secondary | ICD-10-CM | POA: Diagnosis not present

## 2016-09-15 DIAGNOSIS — D2271 Melanocytic nevi of right lower limb, including hip: Secondary | ICD-10-CM | POA: Diagnosis not present

## 2016-09-15 DIAGNOSIS — D225 Melanocytic nevi of trunk: Secondary | ICD-10-CM | POA: Diagnosis not present

## 2016-09-27 DIAGNOSIS — Z111 Encounter for screening for respiratory tuberculosis: Secondary | ICD-10-CM | POA: Diagnosis not present

## 2016-11-04 DIAGNOSIS — H353122 Nonexudative age-related macular degeneration, left eye, intermediate dry stage: Secondary | ICD-10-CM | POA: Diagnosis not present

## 2016-11-04 DIAGNOSIS — H26491 Other secondary cataract, right eye: Secondary | ICD-10-CM | POA: Diagnosis not present

## 2016-11-04 DIAGNOSIS — Z961 Presence of intraocular lens: Secondary | ICD-10-CM | POA: Diagnosis not present

## 2016-11-04 DIAGNOSIS — H524 Presbyopia: Secondary | ICD-10-CM | POA: Diagnosis not present

## 2016-11-12 DIAGNOSIS — H43813 Vitreous degeneration, bilateral: Secondary | ICD-10-CM | POA: Diagnosis not present

## 2016-11-12 DIAGNOSIS — H43393 Other vitreous opacities, bilateral: Secondary | ICD-10-CM | POA: Diagnosis not present

## 2016-11-12 DIAGNOSIS — H35413 Lattice degeneration of retina, bilateral: Secondary | ICD-10-CM | POA: Diagnosis not present

## 2016-11-12 DIAGNOSIS — H15832 Staphyloma posticum, left eye: Secondary | ICD-10-CM | POA: Diagnosis not present

## 2016-12-01 DIAGNOSIS — Z5181 Encounter for therapeutic drug level monitoring: Secondary | ICD-10-CM | POA: Diagnosis not present

## 2016-12-01 DIAGNOSIS — E782 Mixed hyperlipidemia: Secondary | ICD-10-CM | POA: Diagnosis not present

## 2016-12-07 DIAGNOSIS — E782 Mixed hyperlipidemia: Secondary | ICD-10-CM | POA: Diagnosis not present

## 2016-12-07 DIAGNOSIS — Z23 Encounter for immunization: Secondary | ICD-10-CM | POA: Diagnosis not present

## 2016-12-07 DIAGNOSIS — F418 Other specified anxiety disorders: Secondary | ICD-10-CM | POA: Diagnosis not present

## 2016-12-07 DIAGNOSIS — Z0000 Encounter for general adult medical examination without abnormal findings: Secondary | ICD-10-CM | POA: Diagnosis not present

## 2016-12-07 DIAGNOSIS — K219 Gastro-esophageal reflux disease without esophagitis: Secondary | ICD-10-CM | POA: Diagnosis not present

## 2016-12-07 DIAGNOSIS — M150 Primary generalized (osteo)arthritis: Secondary | ICD-10-CM | POA: Diagnosis not present

## 2016-12-24 ENCOUNTER — Other Ambulatory Visit: Payer: Self-pay | Admitting: Family Medicine

## 2016-12-24 DIAGNOSIS — Z1231 Encounter for screening mammogram for malignant neoplasm of breast: Secondary | ICD-10-CM

## 2017-01-20 ENCOUNTER — Ambulatory Visit: Payer: Self-pay

## 2017-02-01 ENCOUNTER — Ambulatory Visit
Admission: RE | Admit: 2017-02-01 | Discharge: 2017-02-01 | Disposition: A | Discharge status: Home / Self Care | Payer: Medicare Other | Source: Ambulatory Visit | Attending: Family Medicine | Admitting: Family Medicine

## 2017-02-01 ENCOUNTER — Encounter (INDEPENDENT_AMBULATORY_CARE_PROVIDER_SITE_OTHER): Payer: Self-pay

## 2017-02-01 DIAGNOSIS — Z1231 Encounter for screening mammogram for malignant neoplasm of breast: Secondary | ICD-10-CM

## 2017-03-09 DIAGNOSIS — E782 Mixed hyperlipidemia: Secondary | ICD-10-CM | POA: Diagnosis not present

## 2017-03-30 DIAGNOSIS — Z1211 Encounter for screening for malignant neoplasm of colon: Secondary | ICD-10-CM | POA: Diagnosis not present

## 2017-03-30 DIAGNOSIS — K644 Residual hemorrhoidal skin tags: Secondary | ICD-10-CM | POA: Diagnosis not present

## 2017-06-07 DIAGNOSIS — Z23 Encounter for immunization: Secondary | ICD-10-CM | POA: Diagnosis not present

## 2017-06-22 DIAGNOSIS — R05 Cough: Secondary | ICD-10-CM | POA: Diagnosis not present

## 2017-09-14 DIAGNOSIS — L821 Other seborrheic keratosis: Secondary | ICD-10-CM | POA: Diagnosis not present

## 2017-09-14 DIAGNOSIS — Z23 Encounter for immunization: Secondary | ICD-10-CM | POA: Diagnosis not present

## 2017-09-14 DIAGNOSIS — Z808 Family history of malignant neoplasm of other organs or systems: Secondary | ICD-10-CM | POA: Diagnosis not present

## 2017-09-14 DIAGNOSIS — D224 Melanocytic nevi of scalp and neck: Secondary | ICD-10-CM | POA: Diagnosis not present

## 2017-09-14 DIAGNOSIS — L859 Epidermal thickening, unspecified: Secondary | ICD-10-CM | POA: Diagnosis not present

## 2017-09-14 DIAGNOSIS — Z85828 Personal history of other malignant neoplasm of skin: Secondary | ICD-10-CM | POA: Diagnosis not present

## 2017-10-10 DIAGNOSIS — M62838 Other muscle spasm: Secondary | ICD-10-CM | POA: Diagnosis not present

## 2017-10-10 DIAGNOSIS — K219 Gastro-esophageal reflux disease without esophagitis: Secondary | ICD-10-CM | POA: Diagnosis not present

## 2017-10-10 DIAGNOSIS — F418 Other specified anxiety disorders: Secondary | ICD-10-CM | POA: Diagnosis not present

## 2017-10-21 DIAGNOSIS — K602 Anal fissure, unspecified: Secondary | ICD-10-CM | POA: Diagnosis not present

## 2017-10-21 DIAGNOSIS — K649 Unspecified hemorrhoids: Secondary | ICD-10-CM | POA: Diagnosis not present

## 2017-11-17 DIAGNOSIS — H43393 Other vitreous opacities, bilateral: Secondary | ICD-10-CM | POA: Diagnosis not present

## 2017-11-17 DIAGNOSIS — H353122 Nonexudative age-related macular degeneration, left eye, intermediate dry stage: Secondary | ICD-10-CM | POA: Diagnosis not present

## 2017-11-17 DIAGNOSIS — H524 Presbyopia: Secondary | ICD-10-CM | POA: Diagnosis not present

## 2017-11-17 DIAGNOSIS — Z961 Presence of intraocular lens: Secondary | ICD-10-CM | POA: Diagnosis not present

## 2017-11-17 DIAGNOSIS — H35412 Lattice degeneration of retina, left eye: Secondary | ICD-10-CM | POA: Diagnosis not present

## 2017-12-12 DIAGNOSIS — E782 Mixed hyperlipidemia: Secondary | ICD-10-CM | POA: Diagnosis not present

## 2017-12-12 DIAGNOSIS — Z79899 Other long term (current) drug therapy: Secondary | ICD-10-CM | POA: Diagnosis not present

## 2017-12-12 DIAGNOSIS — Z23 Encounter for immunization: Secondary | ICD-10-CM | POA: Diagnosis not present

## 2017-12-12 DIAGNOSIS — Z5181 Encounter for therapeutic drug level monitoring: Secondary | ICD-10-CM | POA: Diagnosis not present

## 2017-12-19 DIAGNOSIS — K219 Gastro-esophageal reflux disease without esophagitis: Secondary | ICD-10-CM | POA: Diagnosis not present

## 2017-12-19 DIAGNOSIS — Z0000 Encounter for general adult medical examination without abnormal findings: Secondary | ICD-10-CM | POA: Diagnosis not present

## 2017-12-19 DIAGNOSIS — F418 Other specified anxiety disorders: Secondary | ICD-10-CM | POA: Diagnosis not present

## 2017-12-19 DIAGNOSIS — E782 Mixed hyperlipidemia: Secondary | ICD-10-CM | POA: Diagnosis not present

## 2018-02-22 ENCOUNTER — Other Ambulatory Visit: Payer: Self-pay | Admitting: Family Medicine

## 2018-02-22 DIAGNOSIS — Z1231 Encounter for screening mammogram for malignant neoplasm of breast: Secondary | ICD-10-CM

## 2018-03-14 ENCOUNTER — Ambulatory Visit
Admission: RE | Admit: 2018-03-14 | Discharge: 2018-03-14 | Disposition: A | Discharge status: Home / Self Care | Payer: Medicare Other | Source: Ambulatory Visit | Attending: Family Medicine | Admitting: Family Medicine

## 2018-03-14 DIAGNOSIS — Z1231 Encounter for screening mammogram for malignant neoplasm of breast: Secondary | ICD-10-CM | POA: Diagnosis not present

## 2018-05-26 DIAGNOSIS — Z23 Encounter for immunization: Secondary | ICD-10-CM | POA: Diagnosis not present

## 2018-09-28 DIAGNOSIS — Z85828 Personal history of other malignant neoplasm of skin: Secondary | ICD-10-CM | POA: Diagnosis not present

## 2018-09-28 DIAGNOSIS — L821 Other seborrheic keratosis: Secondary | ICD-10-CM | POA: Diagnosis not present

## 2018-09-28 DIAGNOSIS — L720 Epidermal cyst: Secondary | ICD-10-CM | POA: Diagnosis not present

## 2018-09-28 DIAGNOSIS — D225 Melanocytic nevi of trunk: Secondary | ICD-10-CM | POA: Diagnosis not present

## 2018-09-28 DIAGNOSIS — D2261 Melanocytic nevi of right upper limb, including shoulder: Secondary | ICD-10-CM | POA: Diagnosis not present

## 2018-09-28 DIAGNOSIS — D2271 Melanocytic nevi of right lower limb, including hip: Secondary | ICD-10-CM | POA: Diagnosis not present

## 2018-09-28 DIAGNOSIS — Z23 Encounter for immunization: Secondary | ICD-10-CM | POA: Diagnosis not present

## 2018-09-28 DIAGNOSIS — Z808 Family history of malignant neoplasm of other organs or systems: Secondary | ICD-10-CM | POA: Diagnosis not present

## 2018-12-20 DIAGNOSIS — E782 Mixed hyperlipidemia: Secondary | ICD-10-CM | POA: Diagnosis not present

## 2018-12-20 DIAGNOSIS — Z5181 Encounter for therapeutic drug level monitoring: Secondary | ICD-10-CM | POA: Diagnosis not present

## 2018-12-26 DIAGNOSIS — Z0000 Encounter for general adult medical examination without abnormal findings: Secondary | ICD-10-CM | POA: Diagnosis not present

## 2018-12-26 DIAGNOSIS — Z1211 Encounter for screening for malignant neoplasm of colon: Secondary | ICD-10-CM | POA: Diagnosis not present

## 2019-01-30 ENCOUNTER — Other Ambulatory Visit: Payer: Self-pay | Admitting: Family Medicine

## 2019-01-30 DIAGNOSIS — Z1231 Encounter for screening mammogram for malignant neoplasm of breast: Secondary | ICD-10-CM

## 2019-03-19 ENCOUNTER — Ambulatory Visit
Admission: RE | Admit: 2019-03-19 | Discharge: 2019-03-19 | Disposition: A | Discharge status: Home / Self Care | Payer: Medicare Other | Source: Ambulatory Visit | Attending: Family Medicine | Admitting: Family Medicine

## 2019-03-19 ENCOUNTER — Other Ambulatory Visit: Payer: Self-pay

## 2019-03-19 DIAGNOSIS — Z1231 Encounter for screening mammogram for malignant neoplasm of breast: Secondary | ICD-10-CM | POA: Diagnosis not present

## 2019-03-21 DIAGNOSIS — M65311 Trigger thumb, right thumb: Secondary | ICD-10-CM | POA: Diagnosis not present

## 2019-04-20 DIAGNOSIS — Z23 Encounter for immunization: Secondary | ICD-10-CM | POA: Diagnosis not present

## 2019-05-02 DIAGNOSIS — M65311 Trigger thumb, right thumb: Secondary | ICD-10-CM | POA: Diagnosis not present

## 2019-06-19 DIAGNOSIS — M65311 Trigger thumb, right thumb: Secondary | ICD-10-CM | POA: Diagnosis not present

## 2019-06-19 DIAGNOSIS — M1811 Unilateral primary osteoarthritis of first carpometacarpal joint, right hand: Secondary | ICD-10-CM | POA: Diagnosis not present

## 2019-06-21 DIAGNOSIS — R509 Fever, unspecified: Secondary | ICD-10-CM | POA: Diagnosis not present

## 2019-06-22 ENCOUNTER — Other Ambulatory Visit: Payer: Self-pay

## 2019-06-22 DIAGNOSIS — Z20822 Contact with and (suspected) exposure to covid-19: Secondary | ICD-10-CM

## 2019-06-25 LAB — NOVEL CORONAVIRUS, NAA: SARS-CoV-2, NAA: NOT DETECTED

## 2019-09-27 DIAGNOSIS — D485 Neoplasm of uncertain behavior of skin: Secondary | ICD-10-CM | POA: Diagnosis not present

## 2019-09-27 DIAGNOSIS — Z85828 Personal history of other malignant neoplasm of skin: Secondary | ICD-10-CM | POA: Diagnosis not present

## 2019-09-27 DIAGNOSIS — L821 Other seborrheic keratosis: Secondary | ICD-10-CM | POA: Diagnosis not present

## 2019-09-27 DIAGNOSIS — D2261 Melanocytic nevi of right upper limb, including shoulder: Secondary | ICD-10-CM | POA: Diagnosis not present

## 2019-09-27 DIAGNOSIS — D2262 Melanocytic nevi of left upper limb, including shoulder: Secondary | ICD-10-CM | POA: Diagnosis not present

## 2019-09-27 DIAGNOSIS — D225 Melanocytic nevi of trunk: Secondary | ICD-10-CM | POA: Diagnosis not present

## 2019-09-27 DIAGNOSIS — Z808 Family history of malignant neoplasm of other organs or systems: Secondary | ICD-10-CM | POA: Diagnosis not present

## 2019-09-27 DIAGNOSIS — D2271 Melanocytic nevi of right lower limb, including hip: Secondary | ICD-10-CM | POA: Diagnosis not present

## 2019-09-27 DIAGNOSIS — L578 Other skin changes due to chronic exposure to nonionizing radiation: Secondary | ICD-10-CM | POA: Diagnosis not present

## 2019-09-27 DIAGNOSIS — Z23 Encounter for immunization: Secondary | ICD-10-CM | POA: Diagnosis not present

## 2019-12-24 DIAGNOSIS — Z5181 Encounter for therapeutic drug level monitoring: Secondary | ICD-10-CM | POA: Diagnosis not present

## 2019-12-24 DIAGNOSIS — E782 Mixed hyperlipidemia: Secondary | ICD-10-CM | POA: Diagnosis not present

## 2019-12-31 DIAGNOSIS — F418 Other specified anxiety disorders: Secondary | ICD-10-CM | POA: Diagnosis not present

## 2019-12-31 DIAGNOSIS — Z0000 Encounter for general adult medical examination without abnormal findings: Secondary | ICD-10-CM | POA: Diagnosis not present

## 2019-12-31 DIAGNOSIS — E782 Mixed hyperlipidemia: Secondary | ICD-10-CM | POA: Diagnosis not present

## 2019-12-31 DIAGNOSIS — M150 Primary generalized (osteo)arthritis: Secondary | ICD-10-CM | POA: Diagnosis not present

## 2019-12-31 DIAGNOSIS — K219 Gastro-esophageal reflux disease without esophagitis: Secondary | ICD-10-CM | POA: Diagnosis not present

## 2020-01-02 DIAGNOSIS — H43393 Other vitreous opacities, bilateral: Secondary | ICD-10-CM | POA: Diagnosis not present

## 2020-01-02 DIAGNOSIS — H35413 Lattice degeneration of retina, bilateral: Secondary | ICD-10-CM | POA: Diagnosis not present

## 2020-01-02 DIAGNOSIS — H524 Presbyopia: Secondary | ICD-10-CM | POA: Diagnosis not present

## 2020-01-02 DIAGNOSIS — Z961 Presence of intraocular lens: Secondary | ICD-10-CM | POA: Diagnosis not present

## 2020-01-02 DIAGNOSIS — H26492 Other secondary cataract, left eye: Secondary | ICD-10-CM | POA: Diagnosis not present

## 2020-03-26 ENCOUNTER — Other Ambulatory Visit: Payer: Self-pay | Admitting: Family Medicine

## 2020-03-26 DIAGNOSIS — Z1231 Encounter for screening mammogram for malignant neoplasm of breast: Secondary | ICD-10-CM

## 2020-03-28 ENCOUNTER — Ambulatory Visit
Admission: RE | Admit: 2020-03-28 | Discharge: 2020-03-28 | Disposition: A | Discharge status: Home / Self Care | Payer: Medicare Other | Source: Ambulatory Visit | Attending: Family Medicine | Admitting: Family Medicine

## 2020-03-28 ENCOUNTER — Other Ambulatory Visit: Payer: Self-pay

## 2020-03-28 DIAGNOSIS — Z1231 Encounter for screening mammogram for malignant neoplasm of breast: Secondary | ICD-10-CM | POA: Diagnosis not present

## 2020-05-21 DIAGNOSIS — Z23 Encounter for immunization: Secondary | ICD-10-CM | POA: Diagnosis not present

## 2020-06-07 DIAGNOSIS — Z23 Encounter for immunization: Secondary | ICD-10-CM | POA: Diagnosis not present

## 2020-10-14 DIAGNOSIS — R399 Unspecified symptoms and signs involving the genitourinary system: Secondary | ICD-10-CM | POA: Diagnosis not present

## 2020-10-14 DIAGNOSIS — R5382 Chronic fatigue, unspecified: Secondary | ICD-10-CM | POA: Diagnosis not present

## 2020-10-14 DIAGNOSIS — E611 Iron deficiency: Secondary | ICD-10-CM | POA: Diagnosis not present

## 2020-10-14 DIAGNOSIS — E538 Deficiency of other specified B group vitamins: Secondary | ICD-10-CM | POA: Diagnosis not present

## 2020-10-14 DIAGNOSIS — R42 Dizziness and giddiness: Secondary | ICD-10-CM | POA: Diagnosis not present

## 2020-11-06 DIAGNOSIS — L578 Other skin changes due to chronic exposure to nonionizing radiation: Secondary | ICD-10-CM | POA: Diagnosis not present

## 2020-11-06 DIAGNOSIS — L814 Other melanin hyperpigmentation: Secondary | ICD-10-CM | POA: Diagnosis not present

## 2020-11-06 DIAGNOSIS — Z808 Family history of malignant neoplasm of other organs or systems: Secondary | ICD-10-CM | POA: Diagnosis not present

## 2020-11-06 DIAGNOSIS — D2271 Melanocytic nevi of right lower limb, including hip: Secondary | ICD-10-CM | POA: Diagnosis not present

## 2020-11-06 DIAGNOSIS — Z85828 Personal history of other malignant neoplasm of skin: Secondary | ICD-10-CM | POA: Diagnosis not present

## 2020-11-06 DIAGNOSIS — D225 Melanocytic nevi of trunk: Secondary | ICD-10-CM | POA: Diagnosis not present

## 2020-11-06 DIAGNOSIS — L821 Other seborrheic keratosis: Secondary | ICD-10-CM | POA: Diagnosis not present

## 2020-11-06 DIAGNOSIS — D2261 Melanocytic nevi of right upper limb, including shoulder: Secondary | ICD-10-CM | POA: Diagnosis not present

## 2020-12-11 DIAGNOSIS — N390 Urinary tract infection, site not specified: Secondary | ICD-10-CM | POA: Diagnosis not present

## 2020-12-30 DIAGNOSIS — E782 Mixed hyperlipidemia: Secondary | ICD-10-CM | POA: Diagnosis not present

## 2020-12-30 DIAGNOSIS — Z5181 Encounter for therapeutic drug level monitoring: Secondary | ICD-10-CM | POA: Diagnosis not present

## 2021-01-02 DIAGNOSIS — R399 Unspecified symptoms and signs involving the genitourinary system: Secondary | ICD-10-CM | POA: Diagnosis not present

## 2021-01-02 DIAGNOSIS — Z0000 Encounter for general adult medical examination without abnormal findings: Secondary | ICD-10-CM | POA: Diagnosis not present

## 2021-01-02 DIAGNOSIS — E782 Mixed hyperlipidemia: Secondary | ICD-10-CM | POA: Diagnosis not present

## 2021-01-02 DIAGNOSIS — M150 Primary generalized (osteo)arthritis: Secondary | ICD-10-CM | POA: Diagnosis not present

## 2021-01-02 DIAGNOSIS — K219 Gastro-esophageal reflux disease without esophagitis: Secondary | ICD-10-CM | POA: Diagnosis not present

## 2021-01-06 ENCOUNTER — Other Ambulatory Visit: Payer: Self-pay | Admitting: Family Medicine

## 2021-01-06 DIAGNOSIS — E2839 Other primary ovarian failure: Secondary | ICD-10-CM

## 2021-01-07 DIAGNOSIS — H26492 Other secondary cataract, left eye: Secondary | ICD-10-CM | POA: Diagnosis not present

## 2021-01-07 DIAGNOSIS — Z961 Presence of intraocular lens: Secondary | ICD-10-CM | POA: Diagnosis not present

## 2021-01-07 DIAGNOSIS — H35413 Lattice degeneration of retina, bilateral: Secondary | ICD-10-CM | POA: Diagnosis not present

## 2021-01-07 DIAGNOSIS — H43393 Other vitreous opacities, bilateral: Secondary | ICD-10-CM | POA: Diagnosis not present

## 2021-01-07 DIAGNOSIS — H524 Presbyopia: Secondary | ICD-10-CM | POA: Diagnosis not present

## 2021-01-23 ENCOUNTER — Other Ambulatory Visit: Payer: Self-pay | Admitting: Family Medicine

## 2021-01-23 DIAGNOSIS — Z1231 Encounter for screening mammogram for malignant neoplasm of breast: Secondary | ICD-10-CM

## 2021-01-26 DIAGNOSIS — R399 Unspecified symptoms and signs involving the genitourinary system: Secondary | ICD-10-CM | POA: Diagnosis not present

## 2021-03-12 DIAGNOSIS — K649 Unspecified hemorrhoids: Secondary | ICD-10-CM | POA: Diagnosis not present

## 2021-03-12 DIAGNOSIS — Z86010 Personal history of colonic polyps: Secondary | ICD-10-CM | POA: Diagnosis not present

## 2021-04-09 DIAGNOSIS — K5730 Diverticulosis of large intestine without perforation or abscess without bleeding: Secondary | ICD-10-CM | POA: Diagnosis not present

## 2021-04-09 DIAGNOSIS — D123 Benign neoplasm of transverse colon: Secondary | ICD-10-CM | POA: Diagnosis not present

## 2021-04-09 DIAGNOSIS — Z86010 Personal history of colonic polyps: Secondary | ICD-10-CM | POA: Diagnosis not present

## 2021-04-09 DIAGNOSIS — D122 Benign neoplasm of ascending colon: Secondary | ICD-10-CM | POA: Diagnosis not present

## 2021-04-09 DIAGNOSIS — K648 Other hemorrhoids: Secondary | ICD-10-CM | POA: Diagnosis not present

## 2021-04-10 ENCOUNTER — Other Ambulatory Visit: Payer: Self-pay

## 2021-04-10 ENCOUNTER — Ambulatory Visit
Admission: RE | Admit: 2021-04-10 | Discharge: 2021-04-10 | Disposition: A | Discharge status: Home / Self Care | Payer: Medicare Other | Source: Ambulatory Visit | Attending: Family Medicine | Admitting: Family Medicine

## 2021-04-10 DIAGNOSIS — Z1231 Encounter for screening mammogram for malignant neoplasm of breast: Secondary | ICD-10-CM

## 2021-04-14 DIAGNOSIS — D123 Benign neoplasm of transverse colon: Secondary | ICD-10-CM | POA: Diagnosis not present

## 2021-04-14 DIAGNOSIS — D122 Benign neoplasm of ascending colon: Secondary | ICD-10-CM | POA: Diagnosis not present

## 2021-06-01 DIAGNOSIS — Z23 Encounter for immunization: Secondary | ICD-10-CM | POA: Diagnosis not present

## 2021-06-19 DIAGNOSIS — Z23 Encounter for immunization: Secondary | ICD-10-CM | POA: Diagnosis not present

## 2021-07-06 DIAGNOSIS — N3000 Acute cystitis without hematuria: Secondary | ICD-10-CM | POA: Diagnosis not present

## 2021-07-06 DIAGNOSIS — R3915 Urgency of urination: Secondary | ICD-10-CM | POA: Diagnosis not present

## 2021-07-09 ENCOUNTER — Other Ambulatory Visit: Payer: Self-pay

## 2021-07-09 ENCOUNTER — Ambulatory Visit
Admission: RE | Admit: 2021-07-09 | Discharge: 2021-07-09 | Disposition: A | Discharge status: Home / Self Care | Payer: Medicare Other | Source: Ambulatory Visit | Attending: Family Medicine | Admitting: Family Medicine

## 2021-07-09 DIAGNOSIS — M8589 Other specified disorders of bone density and structure, multiple sites: Secondary | ICD-10-CM | POA: Diagnosis not present

## 2021-07-09 DIAGNOSIS — E2839 Other primary ovarian failure: Secondary | ICD-10-CM

## 2021-07-09 DIAGNOSIS — Z780 Asymptomatic menopausal state: Secondary | ICD-10-CM | POA: Diagnosis not present

## 2021-09-07 DIAGNOSIS — R3121 Asymptomatic microscopic hematuria: Secondary | ICD-10-CM | POA: Diagnosis not present

## 2021-09-07 DIAGNOSIS — N3021 Other chronic cystitis with hematuria: Secondary | ICD-10-CM | POA: Diagnosis not present

## 2021-10-05 DIAGNOSIS — R3121 Asymptomatic microscopic hematuria: Secondary | ICD-10-CM | POA: Diagnosis not present

## 2021-10-05 DIAGNOSIS — R319 Hematuria, unspecified: Secondary | ICD-10-CM | POA: Diagnosis not present

## 2021-10-07 DIAGNOSIS — U071 COVID-19: Secondary | ICD-10-CM | POA: Diagnosis not present

## 2021-10-09 DIAGNOSIS — F418 Other specified anxiety disorders: Secondary | ICD-10-CM | POA: Diagnosis not present

## 2021-10-09 DIAGNOSIS — N3021 Other chronic cystitis with hematuria: Secondary | ICD-10-CM | POA: Diagnosis not present

## 2021-10-09 DIAGNOSIS — R3121 Asymptomatic microscopic hematuria: Secondary | ICD-10-CM | POA: Diagnosis not present

## 2021-11-16 DIAGNOSIS — Z85828 Personal history of other malignant neoplasm of skin: Secondary | ICD-10-CM | POA: Diagnosis not present

## 2021-11-16 DIAGNOSIS — D225 Melanocytic nevi of trunk: Secondary | ICD-10-CM | POA: Diagnosis not present

## 2021-11-16 DIAGNOSIS — L578 Other skin changes due to chronic exposure to nonionizing radiation: Secondary | ICD-10-CM | POA: Diagnosis not present

## 2021-11-16 DIAGNOSIS — L570 Actinic keratosis: Secondary | ICD-10-CM | POA: Diagnosis not present

## 2021-11-16 DIAGNOSIS — D2272 Melanocytic nevi of left lower limb, including hip: Secondary | ICD-10-CM | POA: Diagnosis not present

## 2021-11-16 DIAGNOSIS — D2261 Melanocytic nevi of right upper limb, including shoulder: Secondary | ICD-10-CM | POA: Diagnosis not present

## 2021-11-16 DIAGNOSIS — L814 Other melanin hyperpigmentation: Secondary | ICD-10-CM | POA: Diagnosis not present

## 2021-11-16 DIAGNOSIS — D2271 Melanocytic nevi of right lower limb, including hip: Secondary | ICD-10-CM | POA: Diagnosis not present

## 2021-12-08 IMAGING — MG MM DIGITAL SCREENING BILAT W/ TOMO AND CAD
8 series · 9 of 24 positions shown · non-contrast
Comparison: Previous exam(s).

CLINICAL DATA: Screening.

EXAM:
DIGITAL SCREENING BILATERAL MAMMOGRAM WITH TOMOSYNTHESIS AND CAD
TECHNIQUE: Bilateral screening digital craniocaudal and mediolateral oblique
mammograms were obtained. Bilateral screening digital breast
tomosynthesis was performed. The images were evaluated with
computer-aided detection.

[R MLO synth-2D]
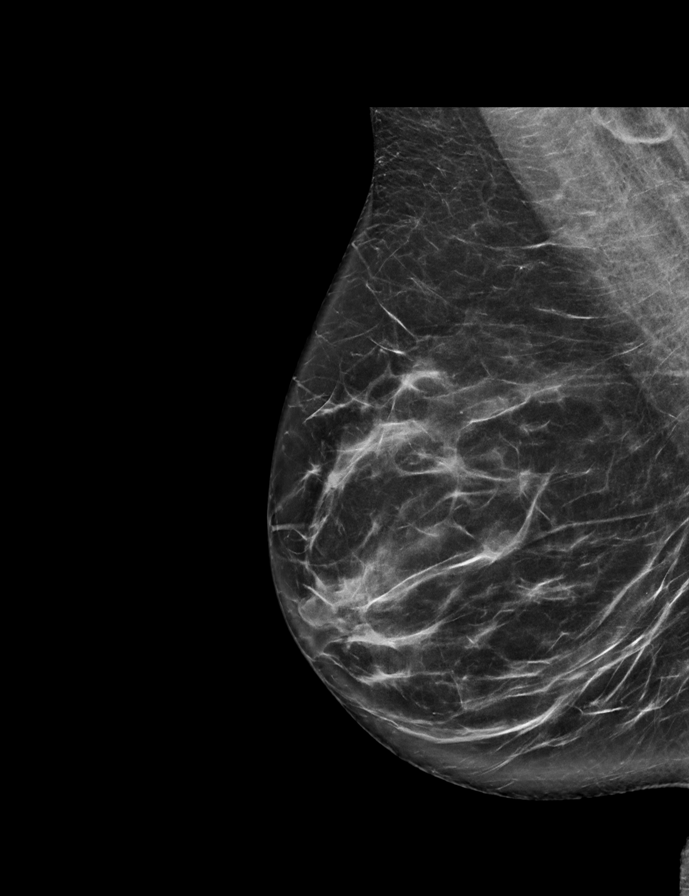

[L MLO synth-2D]
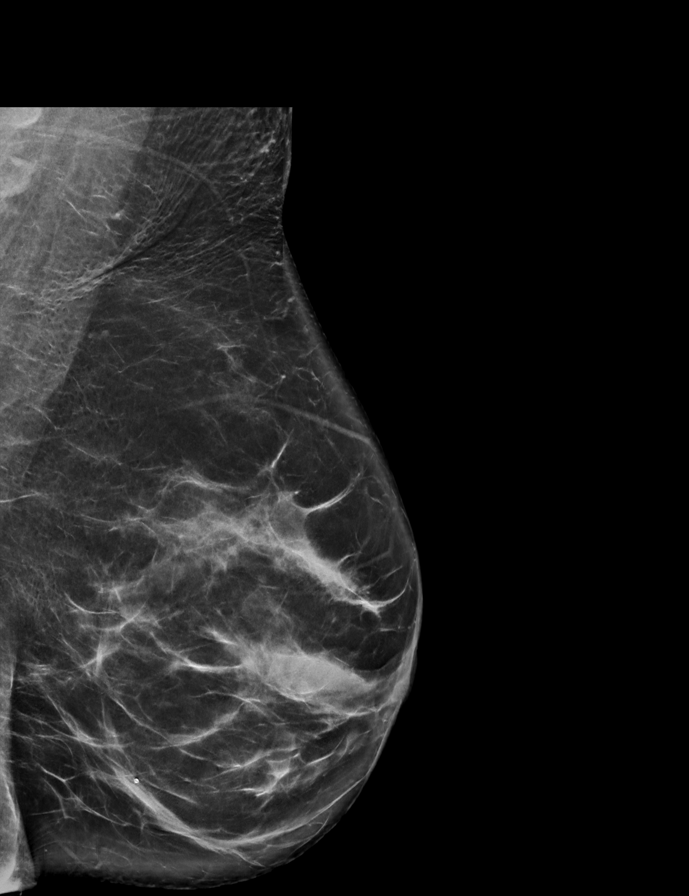

[R CC synth-2D]
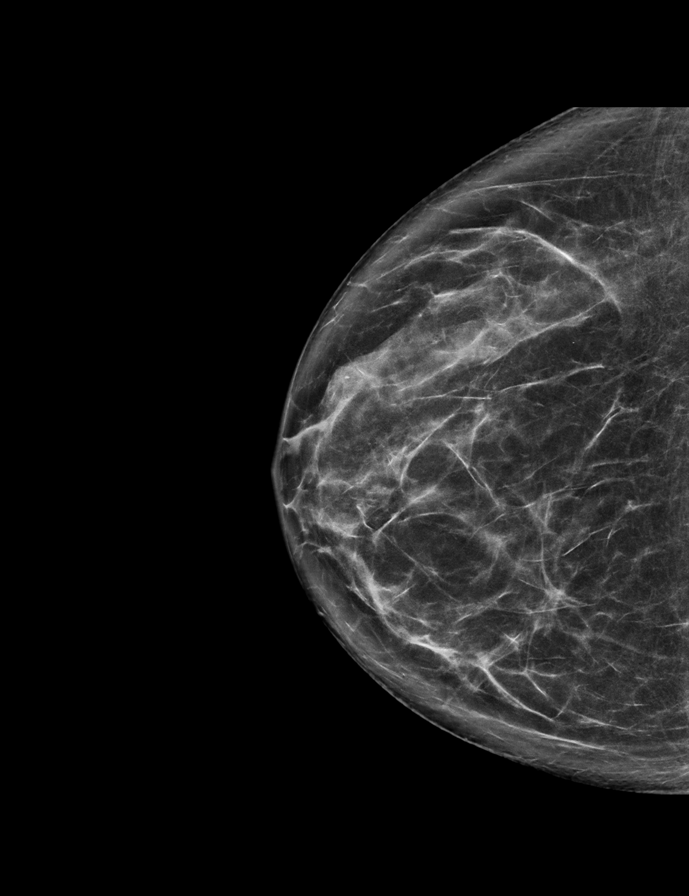

[L CC synth-2D]
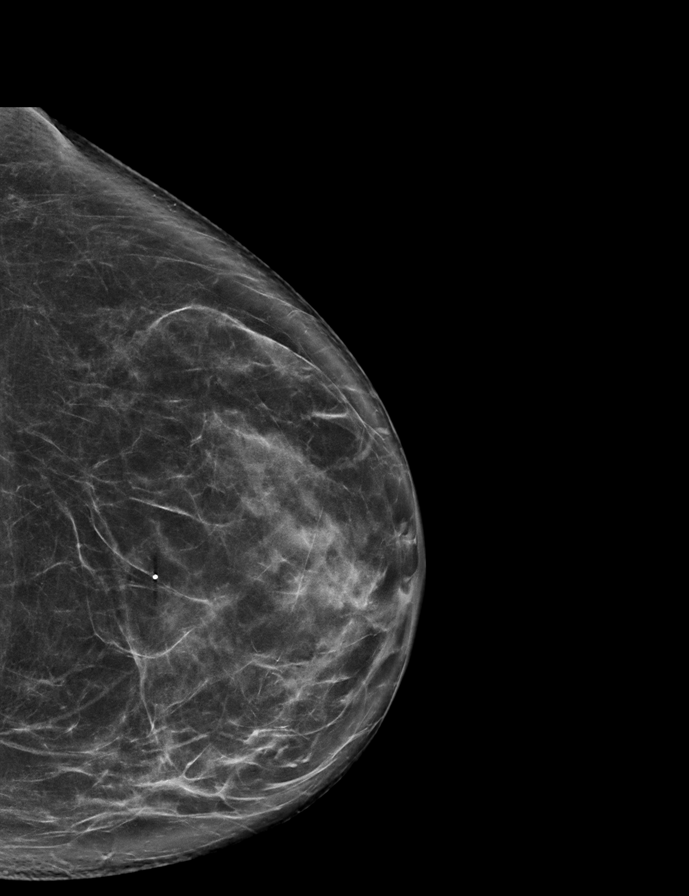

[L MLO tomo · 2 of 79 frames shown]
[frame 26/79]
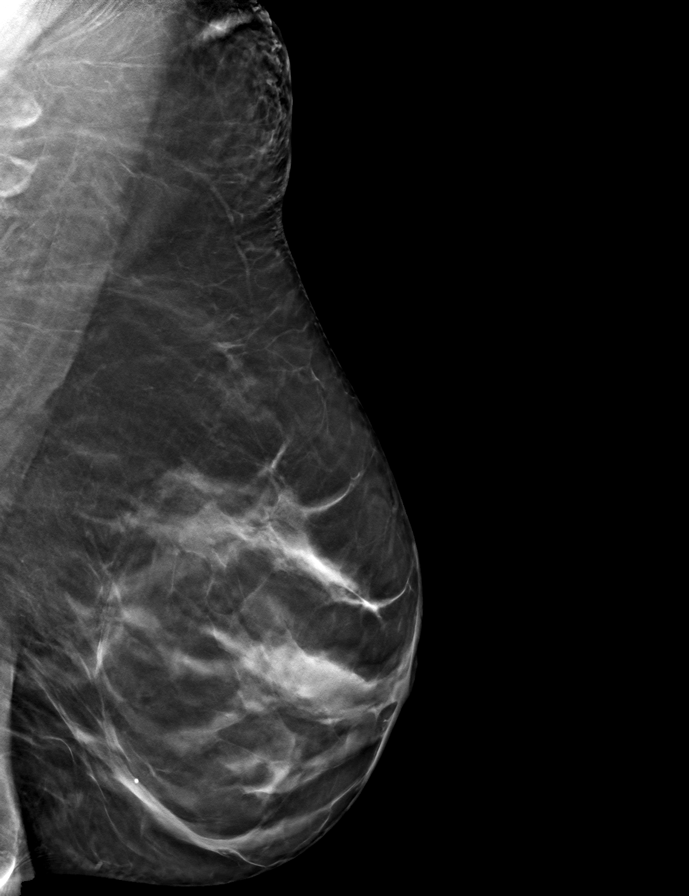
[frame 40/79]
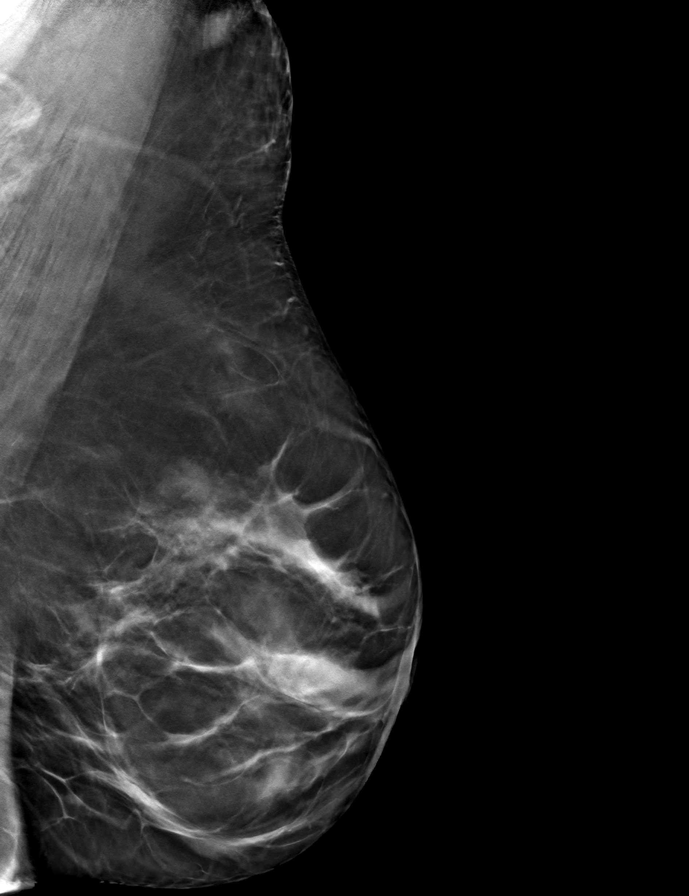

[R CC tomo · tomo slice 37/72.0]
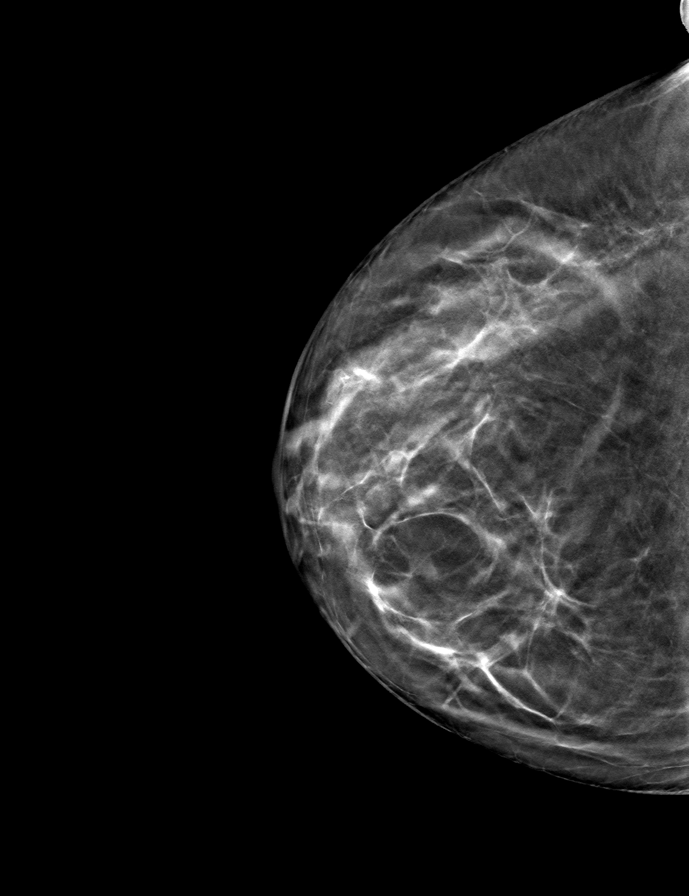

[R MLO tomo · tomo slice 37/74.0]
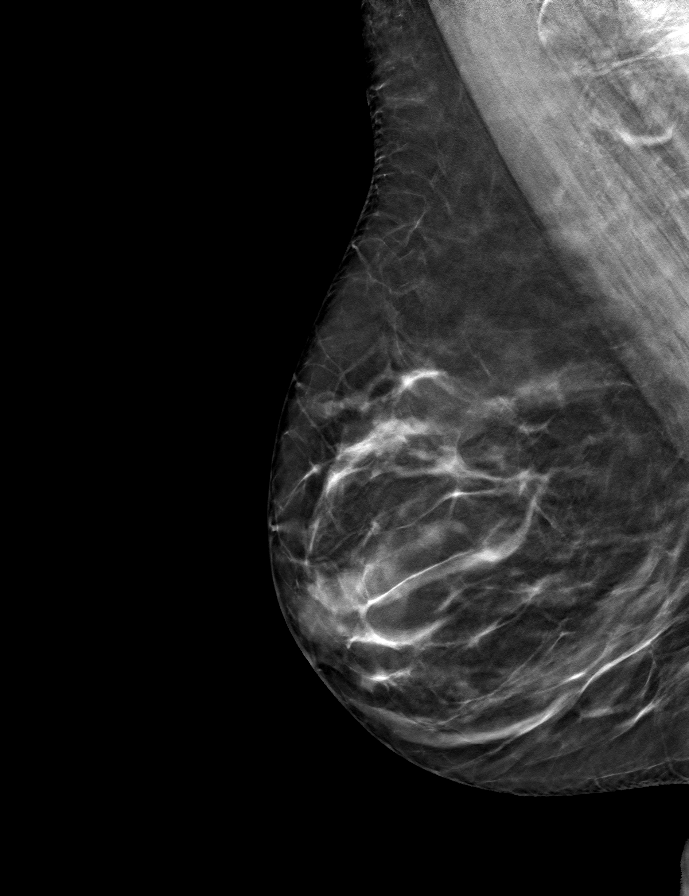

[L CC tomo · tomo slice 37/74.0]
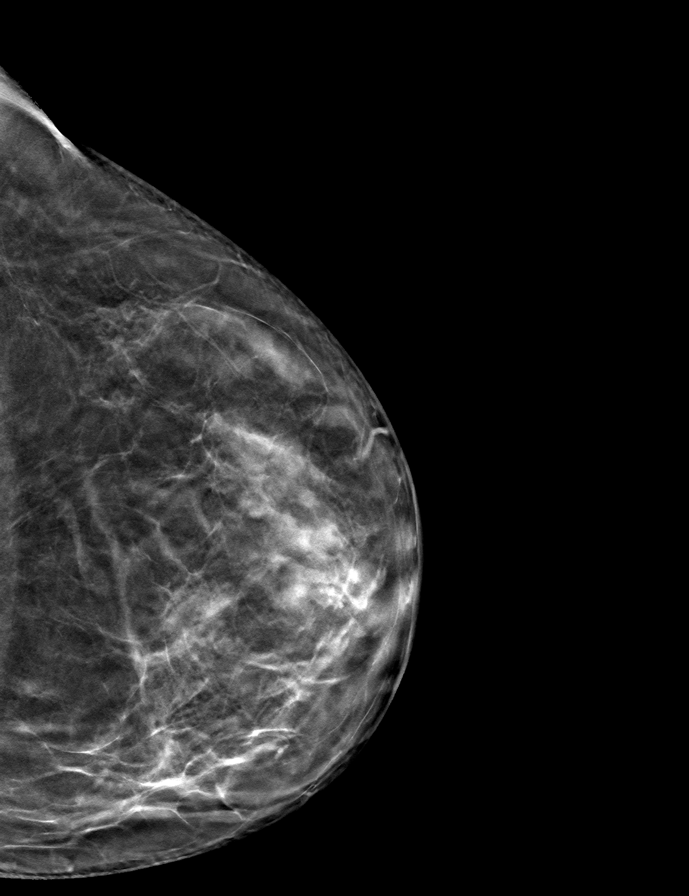

[9 of 24 positions shown; findings below may reference images not displayed]

ACR Breast Density Category c: The breast tissue is heterogeneously
dense, which may obscure small masses.
FINDINGS: There are no findings suspicious for malignancy.
IMPRESSION: No mammographic evidence of malignancy. A result letter of this
screening mammogram will be mailed directly to the patient.

RECOMMENDATION:
Screening mammogram in one year. (Code:Q3-W-BC3)

BI-RADS CATEGORY  1: Negative.

## 2021-12-16 DIAGNOSIS — Z20822 Contact with and (suspected) exposure to covid-19: Secondary | ICD-10-CM | POA: Diagnosis not present

## 2022-01-04 DIAGNOSIS — Z5181 Encounter for therapeutic drug level monitoring: Secondary | ICD-10-CM | POA: Diagnosis not present

## 2022-01-04 DIAGNOSIS — Z79899 Other long term (current) drug therapy: Secondary | ICD-10-CM | POA: Diagnosis not present

## 2022-01-04 DIAGNOSIS — E782 Mixed hyperlipidemia: Secondary | ICD-10-CM | POA: Diagnosis not present

## 2022-01-08 DIAGNOSIS — Z79899 Other long term (current) drug therapy: Secondary | ICD-10-CM | POA: Diagnosis not present

## 2022-01-08 DIAGNOSIS — Z5181 Encounter for therapeutic drug level monitoring: Secondary | ICD-10-CM | POA: Diagnosis not present

## 2022-01-08 DIAGNOSIS — Z1211 Encounter for screening for malignant neoplasm of colon: Secondary | ICD-10-CM | POA: Diagnosis not present

## 2022-01-08 DIAGNOSIS — R42 Dizziness and giddiness: Secondary | ICD-10-CM | POA: Diagnosis not present

## 2022-01-08 DIAGNOSIS — E782 Mixed hyperlipidemia: Secondary | ICD-10-CM | POA: Diagnosis not present

## 2022-01-08 DIAGNOSIS — Z0000 Encounter for general adult medical examination without abnormal findings: Secondary | ICD-10-CM | POA: Diagnosis not present

## 2022-01-13 DIAGNOSIS — H26492 Other secondary cataract, left eye: Secondary | ICD-10-CM | POA: Diagnosis not present

## 2022-01-13 DIAGNOSIS — H43393 Other vitreous opacities, bilateral: Secondary | ICD-10-CM | POA: Diagnosis not present

## 2022-01-13 DIAGNOSIS — Z961 Presence of intraocular lens: Secondary | ICD-10-CM | POA: Diagnosis not present

## 2022-01-13 DIAGNOSIS — H4423 Degenerative myopia, bilateral: Secondary | ICD-10-CM | POA: Diagnosis not present

## 2022-02-12 DIAGNOSIS — Z23 Encounter for immunization: Secondary | ICD-10-CM | POA: Diagnosis not present

## 2022-04-19 ENCOUNTER — Other Ambulatory Visit: Payer: Self-pay | Admitting: Family Medicine

## 2022-04-19 DIAGNOSIS — Z1231 Encounter for screening mammogram for malignant neoplasm of breast: Secondary | ICD-10-CM

## 2022-05-13 ENCOUNTER — Ambulatory Visit
Admission: RE | Admit: 2022-05-13 | Discharge: 2022-05-13 | Disposition: A | Discharge status: Home / Self Care | Payer: Medicare Other | Source: Ambulatory Visit | Attending: Family Medicine | Admitting: Family Medicine

## 2022-05-13 DIAGNOSIS — Z1231 Encounter for screening mammogram for malignant neoplasm of breast: Secondary | ICD-10-CM

## 2022-05-24 DIAGNOSIS — Z23 Encounter for immunization: Secondary | ICD-10-CM | POA: Diagnosis not present

## 2022-07-12 DIAGNOSIS — Z111 Encounter for screening for respiratory tuberculosis: Secondary | ICD-10-CM | POA: Diagnosis not present

## 2022-10-21 DIAGNOSIS — D485 Neoplasm of uncertain behavior of skin: Secondary | ICD-10-CM | POA: Diagnosis not present

## 2022-10-21 DIAGNOSIS — L570 Actinic keratosis: Secondary | ICD-10-CM | POA: Diagnosis not present

## 2022-10-21 DIAGNOSIS — I781 Nevus, non-neoplastic: Secondary | ICD-10-CM | POA: Diagnosis not present

## 2022-10-21 DIAGNOSIS — C44311 Basal cell carcinoma of skin of nose: Secondary | ICD-10-CM | POA: Diagnosis not present

## 2022-11-11 DIAGNOSIS — C44311 Basal cell carcinoma of skin of nose: Secondary | ICD-10-CM | POA: Diagnosis not present

## 2022-11-16 DIAGNOSIS — L578 Other skin changes due to chronic exposure to nonionizing radiation: Secondary | ICD-10-CM | POA: Diagnosis not present

## 2022-11-16 DIAGNOSIS — D2271 Melanocytic nevi of right lower limb, including hip: Secondary | ICD-10-CM | POA: Diagnosis not present

## 2022-11-16 DIAGNOSIS — Z85828 Personal history of other malignant neoplasm of skin: Secondary | ICD-10-CM | POA: Diagnosis not present

## 2022-11-16 DIAGNOSIS — D2261 Melanocytic nevi of right upper limb, including shoulder: Secondary | ICD-10-CM | POA: Diagnosis not present

## 2022-11-16 DIAGNOSIS — D2272 Melanocytic nevi of left lower limb, including hip: Secondary | ICD-10-CM | POA: Diagnosis not present

## 2022-11-16 DIAGNOSIS — D225 Melanocytic nevi of trunk: Secondary | ICD-10-CM | POA: Diagnosis not present

## 2022-11-16 DIAGNOSIS — L814 Other melanin hyperpigmentation: Secondary | ICD-10-CM | POA: Diagnosis not present

## 2022-12-15 DIAGNOSIS — H43393 Other vitreous opacities, bilateral: Secondary | ICD-10-CM | POA: Diagnosis not present

## 2022-12-15 DIAGNOSIS — H26492 Other secondary cataract, left eye: Secondary | ICD-10-CM | POA: Diagnosis not present

## 2022-12-15 DIAGNOSIS — H35413 Lattice degeneration of retina, bilateral: Secondary | ICD-10-CM | POA: Diagnosis not present

## 2022-12-15 DIAGNOSIS — Z961 Presence of intraocular lens: Secondary | ICD-10-CM | POA: Diagnosis not present

## 2023-01-13 DIAGNOSIS — Z79899 Other long term (current) drug therapy: Secondary | ICD-10-CM | POA: Diagnosis not present

## 2023-01-13 DIAGNOSIS — E538 Deficiency of other specified B group vitamins: Secondary | ICD-10-CM | POA: Diagnosis not present

## 2023-01-13 DIAGNOSIS — R7301 Impaired fasting glucose: Secondary | ICD-10-CM | POA: Diagnosis not present

## 2023-01-13 DIAGNOSIS — Z5181 Encounter for therapeutic drug level monitoring: Secondary | ICD-10-CM | POA: Diagnosis not present

## 2023-01-13 DIAGNOSIS — E782 Mixed hyperlipidemia: Secondary | ICD-10-CM | POA: Diagnosis not present

## 2023-01-14 DIAGNOSIS — B309 Viral conjunctivitis, unspecified: Secondary | ICD-10-CM | POA: Diagnosis not present

## 2023-01-14 DIAGNOSIS — J069 Acute upper respiratory infection, unspecified: Secondary | ICD-10-CM | POA: Diagnosis not present

## 2023-01-18 DIAGNOSIS — E782 Mixed hyperlipidemia: Secondary | ICD-10-CM | POA: Diagnosis not present

## 2023-01-18 DIAGNOSIS — Z5181 Encounter for therapeutic drug level monitoring: Secondary | ICD-10-CM | POA: Diagnosis not present

## 2023-01-18 DIAGNOSIS — E538 Deficiency of other specified B group vitamins: Secondary | ICD-10-CM | POA: Diagnosis not present

## 2023-01-18 DIAGNOSIS — Z79899 Other long term (current) drug therapy: Secondary | ICD-10-CM | POA: Diagnosis not present

## 2023-01-18 DIAGNOSIS — F419 Anxiety disorder, unspecified: Secondary | ICD-10-CM | POA: Diagnosis not present

## 2023-01-18 DIAGNOSIS — Z0000 Encounter for general adult medical examination without abnormal findings: Secondary | ICD-10-CM | POA: Diagnosis not present

## 2023-06-23 ENCOUNTER — Other Ambulatory Visit: Payer: Self-pay | Admitting: Family Medicine

## 2023-06-23 DIAGNOSIS — Z1231 Encounter for screening mammogram for malignant neoplasm of breast: Secondary | ICD-10-CM

## 2023-07-17 DIAGNOSIS — Z23 Encounter for immunization: Secondary | ICD-10-CM | POA: Diagnosis not present

## 2023-07-19 ENCOUNTER — Ambulatory Visit
Admission: RE | Admit: 2023-07-19 | Discharge: 2023-07-19 | Disposition: A | Discharge status: Home / Self Care | Payer: Medicare Other | Source: Ambulatory Visit | Attending: Family Medicine

## 2023-07-19 DIAGNOSIS — Z1231 Encounter for screening mammogram for malignant neoplasm of breast: Secondary | ICD-10-CM

## 2023-08-01 DIAGNOSIS — M79671 Pain in right foot: Secondary | ICD-10-CM | POA: Diagnosis not present

## 2023-08-01 DIAGNOSIS — K649 Unspecified hemorrhoids: Secondary | ICD-10-CM | POA: Diagnosis not present

## 2023-08-01 DIAGNOSIS — M79632 Pain in left forearm: Secondary | ICD-10-CM | POA: Diagnosis not present

## 2023-08-05 DIAGNOSIS — L820 Inflamed seborrheic keratosis: Secondary | ICD-10-CM | POA: Diagnosis not present

## 2023-08-05 DIAGNOSIS — D485 Neoplasm of uncertain behavior of skin: Secondary | ICD-10-CM | POA: Diagnosis not present

## 2024-02-06 ENCOUNTER — Other Ambulatory Visit (HOSPITAL_BASED_OUTPATIENT_CLINIC_OR_DEPARTMENT_OTHER): Payer: Self-pay | Admitting: Family Medicine

## 2024-02-06 DIAGNOSIS — E2839 Other primary ovarian failure: Secondary | ICD-10-CM

## 2024-05-08 ENCOUNTER — Other Ambulatory Visit: Payer: Self-pay | Admitting: Medical Genetics

## 2024-05-11 ENCOUNTER — Other Ambulatory Visit (HOSPITAL_COMMUNITY)
Admission: RE | Admit: 2024-05-11 | Discharge: 2024-05-11 | Disposition: A | Discharge status: Home / Self Care | Payer: Self-pay | Source: Ambulatory Visit | Attending: Medical Genetics | Admitting: Medical Genetics

## 2024-05-22 LAB — GENECONNECT MOLECULAR SCREEN: Genetic Analysis Overall Interpretation: NEGATIVE

## 2024-07-17 ENCOUNTER — Other Ambulatory Visit: Payer: Self-pay | Admitting: Family Medicine

## 2024-07-17 DIAGNOSIS — Z1231 Encounter for screening mammogram for malignant neoplasm of breast: Secondary | ICD-10-CM

## 2024-08-08 ENCOUNTER — Inpatient Hospital Stay: Admission: RE | Admit: 2024-08-08 | Discharge: 2024-08-08 | Attending: Family Medicine

## 2024-08-08 DIAGNOSIS — Z1231 Encounter for screening mammogram for malignant neoplasm of breast: Secondary | ICD-10-CM
